# Patient Record
Sex: Female | Born: 1955 | Race: White | Hispanic: No | Marital: Married | Smoking: Former smoker
Health system: Southern US, Community
[De-identification: ages and names within clinical notes are randomized; demographics above are authoritative.]

## PROBLEM LIST (undated history)

## (undated) DIAGNOSIS — M1990 Unspecified osteoarthritis, unspecified site: Secondary | ICD-10-CM

## (undated) DIAGNOSIS — K219 Gastro-esophageal reflux disease without esophagitis: Secondary | ICD-10-CM

## (undated) DIAGNOSIS — T7840XA Allergy, unspecified, initial encounter: Secondary | ICD-10-CM

## (undated) DIAGNOSIS — J40 Bronchitis, not specified as acute or chronic: Principal | ICD-10-CM

## (undated) DIAGNOSIS — I10 Essential (primary) hypertension: Secondary | ICD-10-CM

## (undated) DIAGNOSIS — R0602 Shortness of breath: Secondary | ICD-10-CM

## (undated) DIAGNOSIS — E785 Hyperlipidemia, unspecified: Secondary | ICD-10-CM

## (undated) HISTORY — DX: Allergy, unspecified, initial encounter: T78.40XA

## (undated) HISTORY — DX: Bronchitis, not specified as acute or chronic: J40

## (undated) HISTORY — DX: Unspecified osteoarthritis, unspecified site: M19.90

## (undated) HISTORY — PX: POLYPECTOMY: SHX149

## (undated) HISTORY — DX: Hyperlipidemia, unspecified: E78.5

## (undated) HISTORY — DX: Shortness of breath: R06.02

## (undated) HISTORY — PX: TONSILLECTOMY: SUR1361

## (undated) HISTORY — PX: RETROPUBIC SLING: SHX2343

## (undated) HISTORY — DX: Gastro-esophageal reflux disease without esophagitis: K21.9

## (undated) HISTORY — PX: COLONOSCOPY: SHX174

## (undated) HISTORY — DX: Essential (primary) hypertension: I10

## (undated) HISTORY — PX: VAGINAL HYSTERECTOMY: SUR661

## (undated) HISTORY — PX: CYSTOCELE REPAIR: SHX163

---

## 1984-05-16 HISTORY — PX: DILATION AND CURETTAGE OF UTERUS: SHX78

## 2005-04-04 ENCOUNTER — Encounter: Payer: Self-pay | Admitting: Family Medicine

## 2005-10-03 DIAGNOSIS — J984 Other disorders of lung: Secondary | ICD-10-CM | POA: Insufficient documentation

## 2006-11-14 HISTORY — PX: OTHER SURGICAL HISTORY: SHX169

## 2006-11-14 LAB — CONVERTED CEMR LAB: Pap Smear: NORMAL

## 2007-04-16 HISTORY — PX: BLADDER SUSPENSION: SHX72

## 2007-04-18 ENCOUNTER — Encounter: Payer: Self-pay | Admitting: Family Medicine

## 2007-09-25 ENCOUNTER — Encounter: Payer: Self-pay | Admitting: Family Medicine

## 2007-10-26 ENCOUNTER — Encounter: Payer: Self-pay | Admitting: Family Medicine

## 2007-11-26 ENCOUNTER — Encounter: Payer: Self-pay | Admitting: Family Medicine

## 2008-03-28 ENCOUNTER — Ambulatory Visit: Payer: Self-pay | Admitting: Family Medicine

## 2008-03-28 DIAGNOSIS — M1990 Unspecified osteoarthritis, unspecified site: Secondary | ICD-10-CM | POA: Insufficient documentation

## 2008-03-28 DIAGNOSIS — E785 Hyperlipidemia, unspecified: Secondary | ICD-10-CM | POA: Insufficient documentation

## 2008-03-28 DIAGNOSIS — I10 Essential (primary) hypertension: Secondary | ICD-10-CM | POA: Insufficient documentation

## 2008-03-28 DIAGNOSIS — K219 Gastro-esophageal reflux disease without esophagitis: Secondary | ICD-10-CM | POA: Insufficient documentation

## 2008-05-13 ENCOUNTER — Ambulatory Visit: Payer: Self-pay | Admitting: Family Medicine

## 2008-05-14 LAB — CONVERTED CEMR LAB
Albumin: 3.3 g/dL — ABNORMAL LOW (ref 3.5–5.2)
Alkaline Phosphatase: 68 units/L (ref 39–117)
Bilirubin, Direct: 0.1 mg/dL (ref 0.0–0.3)
GFR calc Af Amer: 113 mL/min
GFR calc non Af Amer: 93 mL/min
LDL Cholesterol: 100 mg/dL — ABNORMAL HIGH (ref 0–99)
Potassium: 3.8 meq/L (ref 3.5–5.1)
Sodium: 138 meq/L (ref 135–145)
VLDL: 23 mg/dL (ref 0–40)

## 2008-06-02 ENCOUNTER — Ambulatory Visit: Payer: Self-pay | Admitting: Family Medicine

## 2008-06-02 DIAGNOSIS — R7309 Other abnormal glucose: Secondary | ICD-10-CM | POA: Insufficient documentation

## 2008-06-02 LAB — CONVERTED CEMR LAB: HDL goal, serum: 40 mg/dL

## 2008-06-03 ENCOUNTER — Ambulatory Visit: Payer: Self-pay | Admitting: Family Medicine

## 2008-06-03 DIAGNOSIS — M2020 Hallux rigidus, unspecified foot: Secondary | ICD-10-CM | POA: Insufficient documentation

## 2008-08-05 ENCOUNTER — Ambulatory Visit: Payer: Self-pay | Admitting: Family Medicine

## 2008-08-18 ENCOUNTER — Ambulatory Visit: Payer: Self-pay | Admitting: Cardiology

## 2008-08-19 ENCOUNTER — Telehealth: Payer: Self-pay | Admitting: Family Medicine

## 2008-12-02 ENCOUNTER — Encounter: Payer: Self-pay | Admitting: Family Medicine

## 2008-12-08 ENCOUNTER — Ambulatory Visit: Payer: Self-pay | Admitting: Family Medicine

## 2008-12-08 LAB — CONVERTED CEMR LAB
ALT: 20 units/L (ref 0–35)
AST: 20 units/L (ref 0–37)
Albumin: 3.5 g/dL (ref 3.5–5.2)
BUN: 13 mg/dL (ref 6–23)
CO2: 26 meq/L (ref 19–32)
Cholesterol: 174 mg/dL (ref 0–200)
GFR calc non Af Amer: 92.88 mL/min (ref >60)
Glucose, Bld: 101 mg/dL — ABNORMAL HIGH (ref 70–99)
Potassium: 3.9 meq/L (ref 3.5–5.1)
Total Protein: 6.7 g/dL (ref 6.0–8.3)
VLDL: 15.4 mg/dL (ref 0.0–40.0)

## 2008-12-11 ENCOUNTER — Ambulatory Visit: Payer: Self-pay | Admitting: Family Medicine

## 2009-01-28 ENCOUNTER — Encounter: Admission: RE | Admit: 2009-01-28 | Discharge: 2009-01-28 | Payer: Self-pay | Admitting: Family Medicine

## 2009-02-03 ENCOUNTER — Encounter (INDEPENDENT_AMBULATORY_CARE_PROVIDER_SITE_OTHER): Payer: Self-pay | Admitting: *Deleted

## 2009-04-07 ENCOUNTER — Encounter (INDEPENDENT_AMBULATORY_CARE_PROVIDER_SITE_OTHER): Payer: Self-pay | Admitting: Internal Medicine

## 2009-04-07 ENCOUNTER — Ambulatory Visit: Payer: Self-pay | Admitting: Family Medicine

## 2009-04-07 ENCOUNTER — Encounter: Payer: Self-pay | Admitting: Internal Medicine

## 2009-04-15 ENCOUNTER — Ambulatory Visit: Payer: Self-pay | Admitting: Internal Medicine

## 2009-04-16 ENCOUNTER — Encounter: Payer: Self-pay | Admitting: Internal Medicine

## 2009-04-18 ENCOUNTER — Ambulatory Visit: Payer: Self-pay | Admitting: Family Medicine

## 2009-04-18 LAB — CONVERTED CEMR LAB: Rapid Strep: POSITIVE

## 2009-04-20 ENCOUNTER — Telehealth (INDEPENDENT_AMBULATORY_CARE_PROVIDER_SITE_OTHER): Payer: Self-pay | Admitting: Radiology

## 2009-04-21 ENCOUNTER — Ambulatory Visit: Payer: Self-pay

## 2009-04-21 ENCOUNTER — Encounter (HOSPITAL_COMMUNITY): Admission: RE | Admit: 2009-04-21 | Discharge: 2009-04-21 | Payer: Self-pay | Admitting: Internal Medicine

## 2009-04-21 ENCOUNTER — Ambulatory Visit: Payer: Self-pay | Admitting: Internal Medicine

## 2009-04-23 ENCOUNTER — Encounter: Payer: Self-pay | Admitting: Internal Medicine

## 2009-04-23 ENCOUNTER — Ambulatory Visit: Payer: Self-pay

## 2009-05-06 ENCOUNTER — Ambulatory Visit: Payer: Self-pay | Admitting: Family Medicine

## 2009-06-01 ENCOUNTER — Ambulatory Visit: Payer: Self-pay | Admitting: Family Medicine

## 2009-06-04 LAB — CONVERTED CEMR LAB
ALT: 22 units/L (ref 0–35)
AST: 21 units/L (ref 0–37)
Albumin: 3.5 g/dL (ref 3.5–5.2)
Alkaline Phosphatase: 65 units/L (ref 39–117)
Bilirubin, Direct: 0.1 mg/dL (ref 0.0–0.3)
CO2: 26 meq/L (ref 19–32)
Chloride: 106 meq/L (ref 96–112)
Glucose, Bld: 87 mg/dL (ref 70–99)
HDL: 48.4 mg/dL (ref >39.00)
Potassium: 4.3 meq/L (ref 3.5–5.1)
Sodium: 141 meq/L (ref 135–145)
Total Protein: 6.2 g/dL (ref 6.0–8.3)

## 2009-06-12 ENCOUNTER — Ambulatory Visit: Payer: Self-pay | Admitting: Family Medicine

## 2009-06-12 DIAGNOSIS — Z8679 Personal history of other diseases of the circulatory system: Secondary | ICD-10-CM | POA: Insufficient documentation

## 2009-07-01 ENCOUNTER — Telehealth: Payer: Self-pay | Admitting: Family Medicine

## 2009-07-02 ENCOUNTER — Ambulatory Visit: Payer: Self-pay | Admitting: Internal Medicine

## 2009-12-03 ENCOUNTER — Encounter (INDEPENDENT_AMBULATORY_CARE_PROVIDER_SITE_OTHER): Payer: Self-pay | Admitting: *Deleted

## 2009-12-08 ENCOUNTER — Ambulatory Visit: Payer: Self-pay | Admitting: Family Medicine

## 2009-12-11 LAB — CONVERTED CEMR LAB
AST: 20 units/L (ref 0–37)
Albumin: 3.7 g/dL (ref 3.5–5.2)
Alkaline Phosphatase: 74 units/L (ref 39–117)
Bilirubin, Direct: 0.1 mg/dL (ref 0.0–0.3)
CO2: 27 meq/L (ref 19–32)
Glucose, Bld: 99 mg/dL (ref 70–99)
Potassium: 3.8 meq/L (ref 3.5–5.1)
Sodium: 138 meq/L (ref 135–145)
Total CHOL/HDL Ratio: 3
Total Protein: 6.6 g/dL (ref 6.0–8.3)

## 2009-12-16 ENCOUNTER — Ambulatory Visit: Payer: Self-pay | Admitting: Family Medicine

## 2010-01-01 ENCOUNTER — Telehealth: Payer: Self-pay | Admitting: Family Medicine

## 2010-01-06 ENCOUNTER — Telehealth: Payer: Self-pay | Admitting: Family Medicine

## 2010-01-29 ENCOUNTER — Encounter: Admission: RE | Admit: 2010-01-29 | Discharge: 2010-01-29 | Payer: Self-pay | Admitting: Family Medicine

## 2010-05-04 ENCOUNTER — Telehealth: Payer: Self-pay | Admitting: Family Medicine

## 2010-06-15 NOTE — Progress Notes (Signed)
Summary: regarding yeast infection  Phone Note Call from Patient Call back at Work Phone [phone]   Caller: Patient Call For: Kerby Nora MD Summary of Call: Pt was seen for a yeast infection under her breast 2 weeks ago.  She says this is not getting any better and she is asking if you want to prescribe a powder or do you want to see her again.  Please advise.  [facility]  Leave message if she is not in. Initial call taken by: Lowella Petties CMA,  July 01, 2009 3:12 PM  Follow-up for Phone Call        If not improving needs to be seen in follow up or Derm referral. Nonurgent.  Follow-up by: Kerby Nora MD,  July 01, 2009 6:07 PM  Additional Follow-up for Phone Call Additional follow up Details #1::        Patient would like to see if she can get in with a dermatologist Additional Follow-up by: Benny Lennert CMA (AAMA),  July 02, 2009 8:21 AM

## 2010-06-15 NOTE — Progress Notes (Signed)
Summary: blood pressure readings  Phone Note Call from Patient Call back at Home Phone [phone]   Caller: Patient Call For: Kerby Nora MD Summary of Call: Patient is calling w/ BP readings-  112/79 on 8-4, 110/75 on 8-8, 96/65 on 8-9, 108/75 on 8/10, 110/78 on 8-15, 107/72 on 8-17, 107/68 on 8-19, 101/67 on 8-21, 104/69 on 8-24. She was told to call and report this. Patient says that it is okay to leave message if she doesn't  answer.  Initial call taken by: Melody Comas,  January 06, 2010 4:58 PM  Follow-up for Phone Call        normal - i would make no changes Hannah Beat MD  January 06, 2010 5:08 PM   Additional Follow-up for Phone Call Additional follow up Details #1::        Patient advised via message on machine as requested by patient.Consuello Masse CMA   Additional Follow-up by: Benny Lennert CMA Duncan Dull),  January 07, 2010 8:44 AM

## 2010-06-15 NOTE — Assessment & Plan Note (Signed)
Summary: F2M/AMD   Primary Provider:  Everrett Coombe NP   CC:  occasional cp, little sob - not to much, and edema all the time (not to bad).  History of Present Illness: 55 y/o woman with history HTN, hyperlipidemia, obesity and GERD. Returns for f/u on her dyspnea.   Since we last saw her had  echo 12/10: EF 55-60% normal. Myoview 12/10 EF 83% normal. Dyspnea much improved. No orthopnea, PND. Mild dependent edema. Still with mild palpitations but improved. No syncpe or presyncope. Walking 1 mile on occasion.     Current Medications (verified): 1)  Vytorin 10-40 Mg Tabs (Ezetimibe-Simvastatin) .... Take 1 Tablet By Mouth Once A Day 2)  Avalide 150-12.5 Mg Tabs (Irbesartan-Hydrochlorothiazide) .... Take 1 Tablet By Mouth Once A Day 3)  Estrace 0.5 Mg Tabs (Estradiol) .... Take 1 Tablet By Mouth Once A Day 4)  Fluticasone Propionate 50 Mcg/act Susp (Fluticasone Propionate) .... One Spray Per Nostril Daily 5)  Glucosamine-Chondroitin   Caps (Glucosamine-Chondroit-Vit C-Mn) .... Take 1 Tablet By Mouth Once A Day 6)  Multivitamins   Tabs (Multiple Vitamin) .... Take 1 Tablet By Mouth Once A Day 7)  Vitamin E 600 Unit  Caps (Vitamin E) .... Take 1 Capsule By Mouth Once A Day 8)  Aspirin 81 Mg Tbec (Aspirin) .... Take One Tablet By Mouth Daily 9)  Metoprolol Succinate 50 Mg Xr24h-Tab (Metoprolol Succinate) .... Take One Tablet By Mouth Daily 10)  Cheratussin Ac 100-10 Mg/43ml Syrp (Guaifenesin-Codeine) .... 5 Ml At Night For Cough As Needed. 11)  Nystatin 100000 Unit/gm Crea (Nystatin) .... Apply To Affected Area Two Times A Day X 2 Weeks..continue 48 Hours After Symptoms Resolved.  Allergies (verified): No Known Drug Allergies  Past History:  Past Medical History: Osteoarthritis GERD Hyperlipidemia Hypertension Dyspnea    --echo 12/10: EF 55-60% normal    --Myoview 12/10 EF 83%. normal  Review of Systems       As per HPI and past medical history; otherwise all systems  negative.   Vital Signs:  Patient profile:   55 year old female Height:      65 inches Weight:      210.25 pounds BMI:     35.11 Pulse rate:   72 / minute Pulse rhythm:   regular BP sitting:   104 / 60  (left arm) Cuff size:   large  Vitals Entered By: Mercer Pod (July 02, 2009 4:05 PM)  Physical Exam  General:  Gen: well appearing. no resp difficulty HEENT: normal Neck: supple. no JVD. Carotids 2+ bilat; no bruits.  Cor: PMI nondisplaced. Regular rate & rhythm. No rubs, gallops, murmur. Lungs: clear Abdomen: soft, nontender, nondistended. No hepatosplenomegaly. No bruits or masses. Good bowel sounds. Extremities: no cyanosis, clubbing, rash, 1+ edema Neuro: alert & orientedx3, cranial nerves grossly intact. moves all 4 extremities w/o difficulty. affect pleasant    Impression & Recommendations:  Problem # 1:  DYSPNEA (ICD-786.05) Imporved. We reviewed all her studies including echo and Myoview. These have been normal. I reassured her. Suspect dyspnea due to weight and decondtioning. Suggested routine exercise program.  Problem # 2:  PALPITATIONS, HX OF (ICD-V12.50) Improved. Continue b-blocker.

## 2010-06-15 NOTE — Letter (Signed)
Summary: [place] No Show Letter  [place] [facility]  [address] [place], [place] [address]   Phone: [phone]  Fax: [phone]    12/03/2009 MRN: [id number]  [facility] [address] [place], [place]  [address]   Dear Ms. Reisen,   Our records indicate that you missed your scheduled appointment with ___lab__________________ on _7.21.11___________.  Please contact this office to reschedule your appointment as soon as possible.  It is important that you keep your scheduled appointments with your physician, so we can provide you the best care possible.  Please be advised that there may be a charge for "no show" appointments.    Sincerely,   [place] [facility]

## 2010-06-15 NOTE — Assessment & Plan Note (Signed)
Summary: FOLLOW UP AFTER LABS/[facility]   Vital Signs:  Patient profile:   55 year old female Weight:      209.13 pounds BMI:     34.93 Temp:     98.0 degrees F oral Pulse rate:   80 / minute Pulse rhythm:   regular BP sitting:   100 / 68  (left arm) Cuff size:   large  Vitals Entered By: Linde Gillis CMA Duncan Dull) (June 12, 2009 4:18 PM) CC: follow-up visit after labs   History of Present Illness: In last few week , intermittantly in past ..left skin rash red under breast, itchy, burning.. Has used gold bond, neosporin.  SOB, Chest pain and palpitations: improved with BBlocker. Stress test and ECHO low risk.   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 Take 1 Tablet By Mouth Once A Day 2)  Avalide 150-12.5 Mg Tabs (Irbesartan-Hydrochlorothiazide) .... Take 1 Tablet By Mouth Once A Day 3)  Estrace 0.5 Mg Tabs (Estradiol) .... Take 1 Tablet By Mouth Once A Day 4)  Fluticasone Propionate 50 Mcg/act Susp (Fluticasone Propionate) .... One Spray Per Nostril Daily 5)  Glucosamine-Chondroitin   Caps (Glucosamine-Chondroit-Vit C-Mn) .... Take 1 Tablet By Mouth Once A Day 6)  Multivitamins   Tabs (Multiple Vitamin) .... Take 1 Tablet By Mouth Once A Day 7)  Vitamin E 600 Unit  Caps (Vitamin E) .... Take 1 Capsule By Mouth Once A Day 8)  Aspirin 81 Mg Tbec (Aspirin) .... Take One Tablet By Mouth Daily 9)   Metoprolol Succinate 50 Mg Xr24h-Tab (Metoprolol Succinate) .... Take One Tablet By Mouth Daily 10)  Cheratussin Ac 100-10 Mg/3ml Syrp (Guaifenesin-Codeine) .... 5 Ml At Night For Cough As Needed. 11)  Nystatin 100000 Unit/gm Crea (Nystatin) .... Apply To Affected Area Two Times A Day X 2 Weeks..continue 48 Hours After Symptoms Resolved.  Allergies (verified): No Known Drug Allergies  Past History:  Past medical, surgical, family and social histories (including risk factors) reviewed, and no changes noted (except as noted below).  Past Medical History: Reviewed history from 03/28/2008 and no changes required. Osteoarthritis GERD Hyperlipidemia Hypertension  Past Surgical History: Reviewed history from 08/07/2008 and no changes required. 04/2007 bladder sling, total vaginal hysterectomy, no cervix 1986 misscarriage, Dand C 11/2006 Chest CT: pulmonary nodules right middle  Family History: Reviewed history from 04/15/2009 and no changes required. father: CAD, HTN, high chol  s/p re-do CABG died 31 CHF mother CAD, HTN, high chol, DM died 76 MI sister: MI < age 28 2 sisters: DM no cancer  Social History: Reviewed history from 04/15/2009 and no changes required. Occupation: Optician, dispensing Married 3 kids: asthma Former Smoker 15 pack year history quit 1989 Alcohol use-yes  1-2 beers daily Drug use-no Regular exercise-no Diet: druits and veggies  Review of Systems General:  Denies fatigue and fever. CV:  Denies chest pain or discomfort. Resp:  Denies shortness of breath. GI:  Denies  abdominal pain. GU:  Denies dysuria.  Physical Exam  General:  Well-developed,well-nourished,in no acute distress; alert,appropriate and cooperative throughout examination Mouth:  MMM no pharyngeal edema, no exudates. Neck:  no carotid bruit or thyromegaly no cervical or supraclavicular lymphadenopathy  Breasts:  erythema under left breast  Lungs:  Normal respiratory effort, chest expands  symmetrically. Lungs are clear to auscultation, no crackles or wheezes. Heart:  Normal rate and regular rhythm. S1 and S2 normal without gallop, murmur, click, rub or other extra sounds. Pulses:  R and L posterior tibial pulses are full and equal bilaterally  Extremities:  no edema   Impression & Recommendations:  Problem # 1:  INTERTRIGO, CANDIDAL (ICD-695.89) Treat with Nystatin topical. Continue for 48 hours after rash resolved. Call if not improving as expected.  Problem # 2:  HYPERTENSION (ICD-401.9) Well controlled. Continue current medication.  Her updated medication list for this problem includes:    Avalide 150-12.5 Mg Tabs (Irbesartan-hydrochlorothiazide) .Marland Kitchen... Take 1 tablet by mouth once a day    Metoprolol Succinate 50 Mg Xr24h-tab (Metoprolol succinate) .Marland Kitchen... Take one tablet by mouth daily  BP today: 100/68 Prior BP: 112/84 (05/06/2009)  Prior 10 Yr Risk Heart Disease: 4 % (12/11/2008)  Labs Reviewed: K+: 4.3 (06/01/2009) Creat: : 0.7 (06/01/2009)   Chol: 160 (06/01/2009)   HDL: 48.40 (06/01/2009)   LDL: 91 (06/01/2009)   TG: 101.0 (06/01/2009)  Problem # 3:  PREDIABETES (ICD-790.29) Resolved.  Labs Reviewed: Creat: 0.7 (06/01/2009)     Problem # 4:  HYPERLIPIDEMIA (ICD-272.4)  Her updated medication list for this problem includes:    Vytorin 10-40 Mg Tabs (Ezetimibe-simvastatin) .Marland Kitchen... Take 1 tablet by mouth once a day Well controlled.   SGOT: 21 (06/01/2009)   SGPT: 22 (06/01/2009)  Lipid Goals: Chol Goal: 200 (06/02/2008)   HDL Goal: 40 (06/02/2008)   LDL Goal: 160 (06/02/2008)   TG Goal: 150 (06/02/2008)  Prior 10 Yr Risk Heart Disease: 4 % (12/11/2008)   HDL:48.40 (06/01/2009), 52.60 (12/08/2008)  LDL:91 (06/01/2009), 106 (16/02/9603)  Chol:160 (06/01/2009), 174 (12/08/2008)  Trig:101.0 (06/01/2009), 77.0 (12/08/2008)  Complete Medication List: 1)  Vytorin 10-40 Mg Tabs (Ezetimibe-simvastatin) .... Take 1 tablet by mouth once a day 2)  Avalide 150-12.5  Mg Tabs (Irbesartan-hydrochlorothiazide) .... Take 1 tablet by mouth once a day 3)  Estrace 0.5 Mg Tabs (Estradiol) .... Take 1 tablet by mouth once a day 4)  Fluticasone Propionate 50 Mcg/act Susp (Fluticasone propionate) .... One spray per nostril daily 5)  Glucosamine-chondroitin Caps (Glucosamine-chondroit-vit c-mn) .... Take 1 tablet by mouth once a day 6)  Multivitamins Tabs (Multiple vitamin) .... Take 1 tablet by mouth once a day 7)  Vitamin E 600 Unit Caps (Vitamin e) .... Take 1 capsule by mouth once a day 8)  Aspirin 81 Mg Tbec (Aspirin) .... Take one tablet by mouth daily 9)  Metoprolol Succinate 50 Mg Xr24h-tab (Metoprolol succinate) .... Take one tablet by mouth daily 10)  Cheratussin Ac 100-10 Mg/71ml Syrp (Guaifenesin-codeine) .... 5 ml at night for cough as needed. 11)  Nystatin 100000 Unit/gm Crea (Nystatin) .... Apply to affected area two times a day x 2 weeks..continue 48 hours after symptoms resolved.   Patient Instructions: 1)  Please schedule a follow-up appointment in 6 months .  2)  BMP prior to visit, ICD-9: 272.0 3)  Hepatic Panel prior to visit ICD-9:  4)  Lipid panel prior to visit ICD-9 :  Prescriptions: NYSTATIN 100000 UNIT/GM CREA (NYSTATIN) Apply to affected area two times a day  x 2 weeks..continue 48 hours after symptoms resolved.  #30 gm x 0   Entered and Authorized by:   Kerby Nora MD   Signed by:   Kerby Nora MD on 06/12/2009   Method used:   Electronically to        [facility]* (retail)       [address]       [place], [place]  [address]       Ph: [phone]       Fax: [phone]   RxID:   [id number] ESTRACE 0.5 MG TABS (ESTRADIOL) Take 1 tablet by mouth once a day  #90 x 3   Entered and Authorized by:   Kerby Nora MD   Signed by:   Kerby Nora MD on 06/12/2009   Method used:   Electronically to        [facility]* (retail)       [address]       [place], [place]  [address]       Ph: [phone]       Fax: [phone]    RxID:   [id number] AVALIDE 150-12.5 MG TABS (IRBESARTAN-HYDROCHLOROTHIAZIDE) Take 1 tablet by mouth once a day  #90 x 3   Entered and Authorized by:   Kerby Nora MD   Signed by:   Kerby Nora MD on 06/12/2009   Method used:   Faxed to ...       [facility] (mail-order)       [address]       [place], [place]  [address]       Ph: [phone]       Fax: [phone]   RxID:   [id number] VYTORIN 10-40 MG TABS (EZETIMIBE-SIMVASTATIN) Take 1 tablet by mouth once a day  #90 x 3   Entered and Authorized by:   Kerby Nora MD   Signed by:   Kerby Nora MD on 06/12/2009   Method used:   Electronically to        [facility]* (retail)       [address]       [place], [place]  [address]       Ph: [phone]       Fax: [phone]   RxID:   [id number] FLUTICASONE PROPIONATE 50 MCG/ACT SUSP (FLUTICASONE PROPIONATE) One spray per nostril daily  #3 x 3   Entered and Authorized by:   Kerby Nora MD   Signed by:   Kerby Nora MD on 06/12/2009   Method used:   Electronically to        [facility]* (retail)       [address]       [place], [place]  [address]       Ph: [phone]       Fax: [phone]   RxID:   [id number]   Current Allergies (reviewed today): No known allergies

## 2010-06-15 NOTE — Assessment & Plan Note (Signed)
Summary: 6 m f/u dlo R/S FROM 12/11/09   Vital Signs:  Patient profile:   55 year old female Height:      65 inches Weight:      205.6 pounds BMI:     34.34 Temp:     98.1 degrees F oral Pulse rate:   72 / minute Pulse rhythm:   regular BP sitting:   100 / 70  (left arm) Cuff size:   large  Vitals Entered By: Benny Lennert CMA Duncan Dull) (December 16, 2009 9:25 AM)  History of Present Illness: Chief complaint 6 month follow up  06/2009 saw Cards for dyspnea.Marland Kitchenecho 12/10: EF 55-60% normal. Myoview 12/10 EF 83% normal. Dyspnea much improved. No orthopnea, PND. Mild dependent edema. Still with mild palpitations but improved. No syncpe or presyncope. Walking 1 mile on occasion. per Card...dyspnea due to weight and deconditioning.  Walking 5 times a day...has lost 8 lbs.   Hypertension History:      She denies headache, chest pain, palpitations, dyspnea with exertion, peripheral edema, neurologic problems, and syncope.  She notes no problems with any antihypertensive medication side effects.  well controlled..not checking at home.   Occ intermittant lightheadedness.        Positive major cardiovascular risk factors include hyperlipidemia and hypertension.  Negative major cardiovascular risk factors include female age less than 12 years old and non-tobacco-user status.    Lipid Management History:      Positive NCEP/ATP III risk factors include hypertension.  Negative NCEP/ATP III risk factors include female age less than 55 years old and non-tobacco-user status.        The patient states that she knows about the "Therapeutic Lifestyle Change" diet.  Her compliance with the TLC diet is good.  The patient expresses understanding of adjunctive measures for cholesterol lowering.  Adjunctive measures started by the patient include aerobic exercise and omega-3 supplements.  She expresses no side effects from her lipid-lowering medication.  The patient denies any symptoms to suggest myopathy or liver  disease.      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 Take 1 Tablet By Mouth Once A Day 2)  Avalide 150-12.5 Mg Tabs (Irbesartan-Hydrochlorothiazide) .... Take 1 Tablet By Mouth Once A Day 3)  Estrace 0.5 Mg Tabs (Estradiol) .... Take 1 Tablet By Mouth Once A Day 4)  Fluticasone Propionate 50 Mcg/act Susp (Fluticasone Propionate) .... One Spray Per Nostril Daily 5)  Glucosamine-Chondroitin   Caps (Glucosamine-Chondroit-Vit C-Mn) .... Take 1 Tablet By Mouth Once A Day 6)  Multivitamins   Tabs (Multiple Vitamin) .... Take 1 Tablet By Mouth Once A Day 7)  Vitamin E 600 Unit  Caps (Vitamin E) .... Take 1 Capsule By Mouth Once A Day 8)  Aspirin 81 Mg Tbec (Aspirin) .... Take One Tablet By Mouth Daily 9)  Metoprolol Succinate 50 Mg Xr24h-Tab (Metoprolol Succinate) .... Take One Tablet By Mouth Daily  Allergies (verified): No Known Drug Allergies  Past History:  Past medical, surgical, family and social histories (including risk factors) reviewed, and no changes noted (except as noted below).  Past Medical History: Reviewed history from 07/02/2009 and no changes required. Osteoarthritis GERD Hyperlipidemia Hypertension Dyspnea    --echo 12/10: EF 55-60% normal    --Myoview 12/10 EF 83%. normal  Past Surgical History: Reviewed history from 08/07/2008 and no changes required. 04/2007 bladder sling, total vaginal  hysterectomy, no cervix 1986 misscarriage, Dand C 11/2006 Chest CT: pulmonary nodules right middle  Family History: Reviewed history from 04/15/2009 and no changes required. father: CAD, HTN, high chol  s/p re-do CABG died 57 CHF mother CAD, HTN, high chol, DM died 31 MI sister: MI < age 80 2 sisters: DM no cancer  Social History: Reviewed history from 04/15/2009 and no changes required. Occupation: Optician, dispensing Married 3 kids: asthma Former Smoker 15 pack year history quit 1989 Alcohol use-yes  1-2 beers daily Drug use-no Regular exercise-no Diet: druits and veggies  Review of Systems       occ sharp pains behind eyes for year...see eye MD...occur once every few months, last 1 sec General:  Denies fatigue. ENT:  Denies earache, nasal congestion, postnasal drainage, and ringing in ears; No vertigo. CV:  Denies chest pain or discomfort. Resp:  Denies cough, sputum productive, and wheezing. GI:  Denies abdominal pain, bloody stools, constipation, and diarrhea. GU:  Denies dysuria.  Physical Exam  General:  obese appearing female in NAD Head:  Normocephalic and atraumatic without obvious abnormalities. No apparent alopecia or balding. Eyes:  No corneal or conjunctival inflammation noted. EOMI. Perrla. Funduscopic exam benign, without hemorrhages, exudates or papilledema. Vision grossly normal. Ears:  External ear exam shows no significant lesions or deformities.  Otoscopic examination reveals clear canals, tympanic membranes are intact bilaterally without bulging, retraction, inflammation or discharge. Hearing is grossly normal bilaterally. Nose:  External nasal examination shows no deformity or inflammation. Nasal mucosa are pink and moist without lesions or exudates. Mouth:  Oral mucosa and oropharynx without lesions or exudates.  Teeth in good repair. Neck:  no carotid bruit or thyromegaly no cervical or supraclavicular lymphadenopathy  Lungs:  Normal respiratory effort,  chest expands symmetrically. Lungs are clear to auscultation, no crackles or wheezes. Heart:  Normal rate and regular rhythm. S1 and S2 normal without gallop, murmur, click, rub or other extra sounds. Abdomen:  Bowel sounds positive,abdomen soft and non-tender without masses, organomegaly or hernias noted. Pulses:  R and L posterior tibial pulses are full and equal bilaterally  Extremities:  no edema  Neurologic:  No cranial nerve deficits noted. Station and gait are normal. Plantar reflexes are down-going bilaterally. DTRs are symmetrical throughout. Sensory, motor and coordinative functions appear intact. Skin:  Intact without suspicious lesions or rashes Psych:  Cognition and judgment appear intact. Alert and cooperative with normal attention span and concentration. No apparent delusions, illusions, hallucinations   Impression & Recommendations:  Problem # 1:  DYSPNEA (ICD-786.05) Improved. Continue exercise and lifestyle changes.  Problem # 2:  PREDIABETES (ICD-790.29) Improved with lifestyle.   Problem # 3:  HYPERTENSION (ICD-401.9) Possibly running too low...check BP with dizzyness.  Call if BP less than 100/70.Marland Kitchenwill consider decreasing avalide.  Her updated medication list for this problem includes:    Avalide 150-12.5 Mg Tabs (Irbesartan-hydrochlorothiazide) .Marland Kitchen... Take 1 tablet by mouth once a day    Metoprolol Succinate 50 Mg Xr24h-tab (Metoprolol succinate) .Marland Kitchen... Take one tablet by mouth daily  Problem # 4:  HYPERLIPIDEMIA (ICD-272.4) Well controlled. Continue current  medication.  Her updated medication list for this problem includes:    Vytorin 10-40 Mg Tabs (Ezetimibe-simvastatin) .Marland Kitchen... Take 1 tablet by mouth once a day  Complete Medication List: 1)  Vytorin 10-40 Mg Tabs (Ezetimibe-simvastatin) .... Take 1 tablet by mouth once a day 2)  Avalide 150-12.5 Mg Tabs (Irbesartan-hydrochlorothiazide) .... Take 1 tablet by mouth once a day 3)  Estrace 0.5 Mg Tabs (Estradiol)  .... Take 1 tablet by mouth once a day 4)  Fluticasone Propionate 50 Mcg/act Susp (Fluticasone propionate) .... One spray per nostril daily 5)  Glucosamine-chondroitin Caps (Glucosamine-chondroit-vit c-mn) .... Take 1 tablet by mouth once a day 6)  Multivitamins Tabs (Multiple vitamin) .... Take 1 tablet by mouth once a day 7)  Vitamin E 600 Unit Caps (Vitamin e) .... Take 1 capsule by mouth once a day 8)  Aspirin 81 Mg Tbec (Aspirin) .... Take one tablet by mouth daily 9)  Metoprolol Succinate 50 Mg Xr24h-tab (Metoprolol succinate) .... Take one tablet by mouth daily  Other Orders: Radiology Referral (Radiology) Tdap => 66yrs IM (44034) Admin 1st Vaccine (74259) Admin 1st Vaccine [facility]) [id number])  Hypertension Assessment/Plan:      The patient's hypertensive risk group is category B: At least one risk factor (excluding diabetes) with no target organ damage.  Her calculated 10 year risk of coronary heart disease is 3 %.  Today's blood pressure is 100/70.  Her blood pressure goal is < 140/90.  Lipid Assessment/Plan:      Based on NCEP/ATP III, the patient's risk factor category is "0-1 risk factors".  The patient's lipid goals are as follows: Total cholesterol goal is 200; LDL cholesterol goal is 160; HDL cholesterol goal is 40; Triglyceride goal is 150.  Her LDL cholesterol goal has been met.    Patient Instructions: 1)  When lightheaded..check BP. Call if running low for consideration of decreasing BP med...less than 100/70. 2)   Schedule CPX in 6 months with fasting lipids, CMET prior Dx 272.0 3)  Referral Appointment Information 4)  Day/Date: 5)  Time: 6)  Place/MD: 7)  Address: 8)  Phone/Fax: 9)  Patient given appointment information. Information/Orders faxed/mailed.  Prescriptions: VYTORIN 10-40 MG TABS (EZETIMIBE-SIMVASTATIN) Take 1 tablet by mouth once a day  #90 x 3   Entered and Authorized by:   Kerby Nora MD   Signed by:   Kerby Nora MD on 12/16/2009   Method used:    Electronically to        [facility]* (retail)       [address]       [place], [place]  [address]       Ph: [phone]       Fax: [phone]   RxID:   [id number] METOPROLOL SUCCINATE 50 MG XR24H-TAB (METOPROLOL SUCCINATE) Take one tablet by mouth daily  #90 x 3   Entered and Authorized by:   Kerby Nora MD   Signed by:   Kerby Nora MD on 12/16/2009   Method used:   Electronically to        [facility]* (retail)       [address]       [place], [place]  [address]       Ph: [phone]       Fax: [phone]   RxID:   [id number]   Current Allergies (reviewed today): No known allergies   Flex Sig Next Due:  Not Indicated Hemoccult Next Due:  Not Indicated     Tetanus/Td Vaccine  Vaccine Type: Tdap    Site: left deltoid    Mfr: [employer]Smith[employer]line    Dose: 0.5 ml    Route: IM    Given by: Benny Lennert CMA (AAMA)    Exp. Date: 11/13/2011    Lot #: [id number]    VIS given: 04/03/07 version given December 16, 2009.    Orders Added: 1)  Radiology Referral [Radiology] 2)  Est. Patient Level IV [09811] 3)  Tdap => 84yrs IM [90715] 4)  Admin 1st Vaccine [90471] 5)  Admin 1st Vaccine [facility]) [phone]

## 2010-06-15 NOTE — Progress Notes (Signed)
Summary: insurance wants to change from [employer] Note Other Incoming   Caller: Marine scientist of Call: Insurance company wants to change pt from avalide to a preferred medicine.  Form is on your desk. Initial call taken by: Lowella Petties CMA,  January 01, 2010 12:05 PM Caller: Rosann Auerbach  Follow-up for Phone Call        Notify pt that we will send in generic equivalent.   Additional Follow-up for Phone Call Additional follow up Details #1::        Patient advised.Consuello Masse CMA   Additional Follow-up by: Benny Lennert CMA Duncan Dull),  January 01, 2010 2:43 PM    New/Updated Medications: LOSARTAN POTASSIUM-HCTZ 50-12.5 MG TABS (LOSARTAN POTASSIUM-HCTZ) Take 1 tablet by mouth once a day Prescriptions: LOSARTAN POTASSIUM-HCTZ 50-12.5 MG TABS (LOSARTAN POTASSIUM-HCTZ) Take 1 tablet by mouth once a day  #30 x 11   Entered and Authorized by:   Kerby Nora MD   Signed by:   Kerby Nora MD on 01/01/2010   Method used:   Electronically to        [facility]* (retail)       [address]       [place], [place]  [address]       Ph: [phone]       Fax: [phone]   RxID:   [id number]

## 2010-06-17 NOTE — Progress Notes (Signed)
Summary: SOB, Vomitting  Phone Note Call from Patient Call back at Home Phone [phone]   Caller: Patient Call For: Kerby Nora MD Summary of Call: Patient states that her BP is 175/92, her pulse is 93 and she feels very dizzy and has started throwing up. She is also having some SOB. I advised her to go to ER. She agreed and will be going to [place].  Initial call taken by: Melody Comas,  May 04, 2010 1:36 PM  Follow-up for Phone Call        agree ASAP ER eval. Hannah Beat MD  May 04, 2010 1:41 PM

## 2010-06-24 ENCOUNTER — Other Ambulatory Visit (INDEPENDENT_AMBULATORY_CARE_PROVIDER_SITE_OTHER): Payer: Managed Care, Other (non HMO)

## 2010-06-24 ENCOUNTER — Encounter (INDEPENDENT_AMBULATORY_CARE_PROVIDER_SITE_OTHER): Payer: Self-pay | Admitting: *Deleted

## 2010-06-24 ENCOUNTER — Other Ambulatory Visit: Payer: Self-pay | Admitting: Family Medicine

## 2010-06-24 DIAGNOSIS — E785 Hyperlipidemia, unspecified: Secondary | ICD-10-CM

## 2010-06-24 LAB — LIPID PANEL
HDL: 50.8 mg/dL (ref >39.00)
Total CHOL/HDL Ratio: 3
VLDL: 10 mg/dL (ref 0.0–40.0)

## 2010-06-24 LAB — BASIC METABOLIC PANEL
CO2: 29 mEq/L (ref 19–32)
Calcium: 8.7 mg/dL (ref 8.4–10.5)
GFR: 102.41 mL/min (ref >60.00)
Glucose, Bld: 96 mg/dL (ref 70–99)
Potassium: 4.1 mEq/L (ref 3.5–5.1)
Sodium: 138 mEq/L (ref 135–145)

## 2010-06-24 LAB — HEPATIC FUNCTION PANEL
AST: 18 U/L (ref 0–37)
Albumin: 3.5 g/dL (ref 3.5–5.2)
Alkaline Phosphatase: 65 U/L (ref 39–117)
Bilirubin, Direct: 0.1 mg/dL (ref 0.0–0.3)

## 2010-06-29 ENCOUNTER — Encounter (INDEPENDENT_AMBULATORY_CARE_PROVIDER_SITE_OTHER): Payer: Managed Care, Other (non HMO) | Admitting: Family Medicine

## 2010-06-29 ENCOUNTER — Encounter: Payer: Self-pay | Admitting: Family Medicine

## 2010-06-29 DIAGNOSIS — Z860100 Personal history of colon polyps, unspecified: Secondary | ICD-10-CM | POA: Insufficient documentation

## 2010-06-29 DIAGNOSIS — Z0000 Encounter for general adult medical examination without abnormal findings: Secondary | ICD-10-CM

## 2010-07-07 NOTE — Assessment & Plan Note (Signed)
Summary: CPX/CLE   Vital Signs:  Patient profile:   55 year old female Height:      65 inches Weight:      183.75 pounds BMI:     30.69 Temp:     97.7 degrees F oral Pulse rate:   72 / minute Pulse rhythm:   regular BP sitting:   102 / 60  (left arm) Cuff size:   large  Vitals Entered By: Linde Gillis CMA Duncan Dull) (June 29, 2010 2:36 PM) CC: complete physical, no pap, Hypertension Management, Lipid Management   History of Present Illness: The patient is here for annual wellness exam and preventative care.     06/2009 saw Cards for dyspnea.Marland Kitchenecho 12/10: EF 55-60% normal. Myoview 12/10 EF 83% normal. Dyspnea much improved. No orthopnea, PND. Mild dependent edema. Still with mild palpitations but improved. No syncpe or presyncope. Walking 1 mile on occasion. per Card...dyspnea due to weight and deconditioning.   HTN, well controlled on losartan. Occ dizziness. Not checking at home. But wsee phone note for lower BPs in 12/2009. 12 more lb weight loss  High cholesterol at goal on Vytorin...lower than ever in past. pt interested in stopping medication.   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  Negative major cardiovascular risk factors include female age less than 58 years old and non-tobacco-user status.    Lipid Management History:      Positive NCEP/ATP III risk factors include hypertension.  Negative NCEP/ATP III risk factors include female age less than 55 years old and non-tobacco-user status.        The patient states that she knows about the "Therapeutic Lifestyle Change" diet.  Her compliance with the TLC diet is good.  The patient expresses understanding of adjunctive measures for cholesterol lowering.  Adjunctive measures started by the patient include aerobic exercise.      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 Take 1 Tablet By Mouth Once A Day 2)  Losartan Potassium-Hctz 50-12.5 Mg Tabs (Losartan Potassium-Hctz) .... Take 1 Tablet By Mouth Once A Day 3)  Estrace 0.5 Mg Tabs (Estradiol) .... Take 1 Tablet By Mouth Once A Day 4)  Fluticasone Propionate 50 Mcg/act Susp (Fluticasone Propionate) .... One Spray Per Nostril Daily 5)  Glucosamine-Chondroitin   Caps (Glucosamine-Chondroit-Vit C-Mn) .... Take 1 Tablet By Mouth Once A Day 6)  Multivitamins   Tabs (Multiple Vitamin) .... Take 1 Tablet By Mouth Once A Day 7)  Vitamin E 600 Unit  Caps (Vitamin E) .... Take 1 Capsule By Mouth Once A Day 8)  Aspirin 81 Mg Tbec (Aspirin) .... Take One Tablet By Mouth Daily 9)  Metoprolol Succinate 50 Mg Xr24h-Tab (Metoprolol Succinate) .... Take One Tablet By Mouth Daily 10)  Fish Oil 1000 Mg Caps (Omega-3 Fatty Acids) .... Take One Tablet By Mouth Daily 11)  Vitamin E 400  Unit Chew (Vitamin E) .... Take One Tablet By Mouth Daily 12)  Vitamin B-12 1000 Mcg Tabs (Cyanocobalamin) .... Take One Tablet By Mouth Daily 13)  Vitamin C 500 Mg Tabs (Ascorbic Acid) .... Take One Tablet By Mouth Daily 14)  Ferrous Sulfate 325 (65 Fe) Mg Tabs (Ferrous Sulfate) .... Take One Tablet By Mouth Daily  Allergies (verified): No Known Drug Allergies  Past History:  Past medical, surgical, family and social histories (including risk factors) reviewed for relevance to current acute and  chronic problems.  Past Medical History: Reviewed history from 07/02/2009 and no changes required. Osteoarthritis GERD Hyperlipidemia Hypertension Dyspnea    --echo 12/10: EF 55-60% normal    --Myoview 12/10 EF 83%. normal  Past Surgical History: Reviewed history from 08/07/2008 and no changes required. 04/2007 bladder sling, total vaginal hysterectomy, no cervix 1986 misscarriage, Dand C 11/2006 Chest CT: pulmonary nodules right middle  Family History: Reviewed history from 04/15/2009 and no changes required. father: CAD, HTN, high chol  s/p re-do CABG died 60 CHF mother CAD, HTN, high chol, DM died 36 MI sister: MI < age 74 2 sisters: DM no cancer  Social History: Reviewed history from 04/15/2009 and no changes required. Occupation: Optician, dispensing Married 3 kids: asthma Former Smoker 15 pack year history quit 1989 Alcohol use-yes  1-2 beers daily Drug use-no Regular exercise-no Diet: druits and veggies  Review of Systems General:  Denies fatigue and fever. CV:  Denies chest pain or discomfort and swelling of feet. Resp:  Denies shortness of breath. GI:  Denies abdominal pain, bloody stools, constipation, and diarrhea. GU:  Denies dysuria. Derm:  Denies lesion(s).  Physical Exam  General:  overweight  appearing female in NAD Head:  Normocephalic and atraumatic without obvious abnormalities. No apparent alopecia or balding. Ears:  External ear exam shows no significant lesions or deformities.  Otoscopic examination reveals clear canals, tympanic membranes are intact bilaterally without bulging, retraction, inflammation or discharge. Hearing is grossly normal bilaterally. Nose:  External nasal examination shows no deformity or inflammation. Nasal mucosa are pink and moist without lesions or exudates. Mouth:  Oral mucosa and oropharynx without lesions or exudates.  Teeth in good repair. Neck:  no carotid bruit or thyromegaly no cervical or supraclavicular  lymphadenopathy  Breasts:  No mass, nodules, thickening, tenderness, bulging, retraction, inflamation, nipple discharge or skin changes noted.   Candidal infection.. under left breast.. covered in powder Lungs:  Normal respiratory effort, chest expands symmetrically. Lungs are clear to auscultation, no crackles or wheezes. Heart:  Normal rate and regular rhythm. S1 and S2 normal without gallop, murmur, click, rub or other extra sounds. Abdomen:  Bowel sounds positive,abdomen soft and non-tender without masses, organomegaly or hernias noted. Genitalia:  not indicated  Msk:  No deformity or scoliosis noted of thoracic or lumbar spine.   Pulses:  R and L posterior tibial pulses are full and equal bilaterally  Extremities:  no edema  Neurologic:  No cranial nerve deficits noted. Station and gait are normal. Plantar reflexes are down-going bilaterally. DTRs are symmetrical throughout. Sensory, motor and coordinative functions appear intact. Skin:  Intact without suspicious lesions or rashes Psych:  Cognition and judgment appear intact. Alert and cooperative with normal attention span and concentration. No apparent delusions, illusions, hallucinations   Impression & Recommendations:  Problem # 1:  PHYSICAL EXAMINATION (ICD-V70.0) The patient's preventative maintenance and recommended screening tests for an annual wellness exam were reviewed in full today. Brought up to date unless services declined.  Counselled on the importance of diet, exercise, and its role in overall health and mortality. The patient's FH and SH was reviewed, including their home life, tobacco status, and drug and alcohol status.     Problem # 2:  HYPERTENSION (ICD-401.9) Given low BPs and occ dizziness. Will try to reduce dose of med in half.  Her updated medication list for this problem includes:    Losartan Potassium 50 Mg Tabs (Losartan potassium) .Marland Kitchen... Take 1 tablet by mouth once a day    Metoprolol Succinate 50 Mg  Xr24h-tab (Metoprolol succinate) .Marland Kitchen... Take one tablet by mouth daily  Problem # 3:  HYPERLIPIDEMIA (ICD-272.4) Improved.. will try having he stop her med. Continue lifestyle changes. Great job! The following medications were removed from the medication list:    Vytorin 10-40 Mg Tabs (Ezetimibe-simvastatin) .Marland Kitchen... Take 1 tablet by mouth once a day  Problem # 4:  PREDIABETES (ICD-790.29) Resolved.   Complete Medication List: 1)  Losartan Potassium 50 Mg Tabs (Losartan potassium) .... Take 1 tablet by mouth once a day 2)  Estrace 0.5 Mg Tabs (Estradiol) .... Take 1 tablet by mouth once a day 3)  Fluticasone Propionate 50 Mcg/act Susp (Fluticasone propionate) .... One spray per nostril daily 4)  Glucosamine-chondroitin Caps (Glucosamine-chondroit-vit c-mn) .... Take 1 tablet by mouth once a day 5)  Multivitamins Tabs (Multiple vitamin) .... Take 1 tablet by mouth once a day 6)  Vitamin E 600 Unit Caps (Vitamin e) .... Take 1 capsule by mouth once a day 7)  Aspirin 81 Mg Tbec (Aspirin) .... Take one tablet by mouth daily 8)  Metoprolol Succinate 50 Mg Xr24h-tab (Metoprolol succinate) .... Take one tablet by mouth daily 9)  Fish Oil 1000 Mg Caps (Omega-3 fatty acids) .... Take one tablet by mouth daily 10)  Vitamin E 400 Unit Chew (Vitamin e) .... Take one tablet by mouth daily 11)  Vitamin B-12 1000 Mcg Tabs (Cyanocobalamin) .... Take one tablet by mouth daily 12)  Vitamin C 500 Mg Tabs (Ascorbic acid) .... Take one tablet by mouth daily 13)  Ferrous Sulfate 325 (65 Fe) Mg Tabs (Ferrous sulfate) .... Take one tablet by mouth daily  Other Orders: Gastroenterology Referral (GI)  Hypertension Assessment/Plan:      The patient's hypertensive risk group is category B: At least one risk factor (excluding diabetes) with no target organ damage.  Her calculated 10 year risk of coronary heart disease is 3 %.  Today's blood pressure is 102/60.  Her blood pressure goal is < 140/90.  Lipid  Assessment/Plan:      Based on NCEP/ATP III, the patient's risk factor category is "0-1 risk factors".  The patient's lipid goals are as follows: Total cholesterol goal is 200; LDL cholesterol goal is 160; HDL cholesterol goal is 40; Triglyceride goal is 150.  Her LDL cholesterol goal has been met.    Patient Instructions: 1)  Change losarttan/HCTZ to losartan alone at 50 mg daily. 2)   Follow BP every other day... call if BP remaining low and dizziness continuing or if BP high >140/90.  3)   Stop Vytorin.  4)  Recheck fasting LIPIDS  in 3 months Dx 272.0    5)  Please schedule a follow-up appointment in 6 months .  6)  Referral Appointment Information 7)  Day/Date: 8)  Time: 9)  Place/MD: 10)  Address: 11)  Phone/Fax: 12)  Patient given appointment information. Information/Orders faxed/mailed.  Prescriptions: LOSARTAN POTASSIUM 50 MG TABS (LOSARTAN POTASSIUM) Take 1 tablet  by mouth once a day  #30 x 11   Entered and Authorized by:   Kerby Nora MD   Signed by:   Kerby Nora MD on 06/29/2010   Method used:   Electronically to        [facility]* (retail)       [id number]       [place], [place]  [address]       Ph: [phone]       Fax: [phone]   RxID:   [id number]    Orders Added: 1)  Gastroenterology Referral [GI] 2)  Est. Patient 40-64 years [[address]]    Current Allergies (reviewed today): No known allergies   Last Flu Vaccine:  Fluvax 3+ (04/07/2009 10:39:04 AM) Flu Vaccine Result Date:  03/16/2010 Flu Vaccine Result:  given Flu Vaccine Next Due:  1 yr Last PAP:  Normal (11/14/2207 2:47:02 PM) PAP Next Due:  Not Indicated     Past Medical History:    Reviewed history from 07/02/2009 and no changes required:       Osteoarthritis       GERD       Hyperlipidemia       Hypertension       Dyspnea          --echo 12/10: EF 55-60% normal          --Myoview 12/10 EF 83%. normal  Past Surgical History:    Reviewed history from 08/07/2008 and no  changes required:       04/2007 bladder sling, total vaginal hysterectomy, no cervix       1986 misscarriage, Dand C       11/2006 Chest CT: pulmonary nodules right middle

## 2010-07-22 NOTE — Procedures (Addendum)
Summary: Colonoscopy Report/[facility]  Colonoscopy Report/[facility]   Imported By: Maryln Gottron 07/12/2010 12:30:26  _____________________________________________________________________  External Attachment:    Type:   Image     Comment:   External Document  Appended Document: Orders Update  NO specific recomendations made on colonoscopy procedure report.. polyps seen... need to obtain path report as well to determine surviellience recommendations... per pt told verbally by previous MD to repeat in 2012.  Kerby Nora MD  July 12, 2010 12:47 PM   Clinical Lists Changes

## 2010-09-22 ENCOUNTER — Ambulatory Visit (AMBULATORY_SURGERY_CENTER): Payer: BC Managed Care – PPO | Admitting: *Deleted

## 2010-09-22 VITALS — Ht 64.0 in | Wt 180.9 lb

## 2010-09-22 DIAGNOSIS — Z1211 Encounter for screening for malignant neoplasm of colon: Secondary | ICD-10-CM

## 2010-09-22 MED ORDER — PEG-KCL-NACL-NASULF-NA ASC-C 100 G PO SOLR: ORAL | Status: DC

## 2010-09-22 MED ORDER — ASPIRIN EC 81 MG PO TBEC: 81.0000 mg | DELAYED_RELEASE_TABLET | Freq: Every day | ORAL | Status: AC

## 2010-09-27 ENCOUNTER — Other Ambulatory Visit (INDEPENDENT_AMBULATORY_CARE_PROVIDER_SITE_OTHER): Payer: BC Managed Care – PPO

## 2010-09-27 ENCOUNTER — Telehealth: Payer: Self-pay | Admitting: *Deleted

## 2010-09-27 DIAGNOSIS — E7800 Pure hypercholesterolemia, unspecified: Secondary | ICD-10-CM

## 2010-09-27 LAB — LDL CHOLESTEROL, DIRECT: Direct LDL: 160.1 mg/dL

## 2010-09-27 LAB — LIPID PANEL
HDL: 60.6 mg/dL (ref >39.00)
Total CHOL/HDL Ratio: 4
VLDL: 19.2 mg/dL (ref 0.0–40.0)

## 2010-09-27 NOTE — Progress Notes (Signed)
Addended by: Melody Comas on: 09/27/2010 08:14 AM   Modules accepted: Orders

## 2010-09-27 NOTE — Telephone Encounter (Signed)
Pt has brought in a list of her BP and pulse readings, List is on your desk.

## 2010-09-28 NOTE — Telephone Encounter (Signed)
Notify pt that BPs are well controlled and running in nml range. See scanned in sheet. Continue lower dose of medicaiton.

## 2010-09-29 NOTE — Telephone Encounter (Signed)
Patient advised.

## 2010-10-05 ENCOUNTER — Ambulatory Visit (AMBULATORY_SURGERY_CENTER): Payer: BC Managed Care – PPO | Admitting: Internal Medicine

## 2010-10-05 ENCOUNTER — Encounter: Payer: Self-pay | Admitting: Internal Medicine

## 2010-10-05 VITALS — BP 145/66 | HR 45 | Temp 97.4°F | Resp 18 | Ht 64.75 in | Wt 178.0 lb

## 2010-10-05 DIAGNOSIS — Z86010 Personal history of colonic polyps: Secondary | ICD-10-CM

## 2010-10-05 DIAGNOSIS — Z1211 Encounter for screening for malignant neoplasm of colon: Secondary | ICD-10-CM

## 2010-10-05 DIAGNOSIS — D1330 Benign neoplasm of unspecified part of small intestine: Secondary | ICD-10-CM

## 2010-10-05 DIAGNOSIS — D126 Benign neoplasm of colon, unspecified: Secondary | ICD-10-CM

## 2010-10-05 DIAGNOSIS — K635 Polyp of colon: Secondary | ICD-10-CM

## 2010-10-05 MED ORDER — SODIUM CHLORIDE 0.9 % IV SOLN: 500.0000 mL | INTRAVENOUS | Status: DC

## 2010-10-05 NOTE — Patient Instructions (Addendum)
Resume all medications. Information on polyps given.

## 2010-10-06 ENCOUNTER — Encounter: Payer: Self-pay | Admitting: *Deleted

## 2010-10-06 ENCOUNTER — Telehealth: Payer: Self-pay

## 2010-10-06 NOTE — Telephone Encounter (Signed)
No ID on answering machine. 

## 2010-10-14 ENCOUNTER — Other Ambulatory Visit: Payer: Self-pay | Admitting: *Deleted

## 2010-10-14 MED ORDER — ESTRADIOL 0.5 MG PO TABS: 0.5000 mg | ORAL_TABLET | Freq: Every day | ORAL | Status: DC

## 2010-11-05 ENCOUNTER — Other Ambulatory Visit: Payer: Managed Care, Other (non HMO) | Admitting: Internal Medicine

## 2010-12-07 ENCOUNTER — Encounter: Payer: Self-pay | Admitting: Internal Medicine

## 2010-12-20 ENCOUNTER — Ambulatory Visit: Payer: Managed Care, Other (non HMO) | Admitting: Family Medicine

## 2010-12-28 ENCOUNTER — Ambulatory Visit: Payer: Managed Care, Other (non HMO) | Admitting: Family Medicine

## 2010-12-28 ENCOUNTER — Telehealth: Payer: Self-pay | Admitting: Family Medicine

## 2010-12-28 ENCOUNTER — Other Ambulatory Visit (INDEPENDENT_AMBULATORY_CARE_PROVIDER_SITE_OTHER): Payer: BC Managed Care – PPO | Admitting: Family Medicine

## 2010-12-28 DIAGNOSIS — I10 Essential (primary) hypertension: Secondary | ICD-10-CM

## 2010-12-28 DIAGNOSIS — E785 Hyperlipidemia, unspecified: Secondary | ICD-10-CM

## 2010-12-28 DIAGNOSIS — R7309 Other abnormal glucose: Secondary | ICD-10-CM

## 2010-12-28 LAB — COMPREHENSIVE METABOLIC PANEL
Albumin: 3.6 g/dL (ref 3.5–5.2)
BUN: 17 mg/dL (ref 6–23)
Calcium: 8.8 mg/dL (ref 8.4–10.5)
Chloride: 104 mEq/L (ref 96–112)
Creatinine, Ser: 0.6 mg/dL (ref 0.4–1.2)
GFR: 108.04 mL/min (ref >60.00)
Glucose, Bld: 97 mg/dL (ref 70–99)
Potassium: 4.1 mEq/L (ref 3.5–5.1)

## 2010-12-28 LAB — LIPID PANEL
Cholesterol: 276 mg/dL — ABNORMAL HIGH (ref 0–200)
Triglycerides: 122 mg/dL (ref 0.0–149.0)

## 2010-12-28 NOTE — Telephone Encounter (Signed)
Message copied by Kerby Nora E on Tue Dec 28, 2010  1:22 AM ------      Message from: Liane Comber C      Created: Wed Dec 22, 2010 10:17 AM      Regarding: 6 month f/u labs tues 8/14       Please order  future labs for pt's upcomming lab appt.      Thanks      Rodney Booze

## 2010-12-30 ENCOUNTER — Encounter: Payer: Self-pay | Admitting: Family Medicine

## 2010-12-30 ENCOUNTER — Ambulatory Visit (INDEPENDENT_AMBULATORY_CARE_PROVIDER_SITE_OTHER): Payer: BC Managed Care – PPO | Admitting: Family Medicine

## 2010-12-30 DIAGNOSIS — Z8679 Personal history of other diseases of the circulatory system: Secondary | ICD-10-CM

## 2010-12-30 DIAGNOSIS — I10 Essential (primary) hypertension: Secondary | ICD-10-CM

## 2010-12-30 DIAGNOSIS — R7309 Other abnormal glucose: Secondary | ICD-10-CM

## 2010-12-30 DIAGNOSIS — E785 Hyperlipidemia, unspecified: Secondary | ICD-10-CM

## 2010-12-30 MED ORDER — SIMVASTATIN 20 MG PO TABS: 20.0000 mg | ORAL_TABLET | Freq: Every day | ORAL | Status: DC

## 2010-12-30 NOTE — Assessment & Plan Note (Signed)
Worsened control.. Not exercising  And poor vacation diet.  Planning on getting back on track. Also off Vytorin given was very low in past... Will restart with generic simvastatin low dose. Recheck in 3 months.

## 2010-12-30 NOTE — Assessment & Plan Note (Signed)
Resolved

## 2010-12-30 NOTE — Assessment & Plan Note (Signed)
Stable on metoprolol. °

## 2010-12-30 NOTE — Patient Instructions (Addendum)
If feeling lightheaded check BP.. Call and let us know if running < 90/60, or consistently. We could consider decreasing losartan to 25 mg daily.  Return to regular exercise routine. Work on healthy eating habits.  Lab eval in 3 months.  Next follow is CPX in about 6 months or after I return.

## 2010-12-30 NOTE — Assessment & Plan Note (Signed)
BP well controlled. Still some occ lightheadedness. If low BPs associated at home.. Can consider decreasing losartan to 25 mg daily.

## 2010-12-30 NOTE — Progress Notes (Signed)
  Subjective:    Patient ID: Bianca Hogan, female    DOB: 09-29-1955, 55 y.o.   MRN: [id number]  HPI Hypertension:  Well controlled on losartan , metoprolol  Using medication without problems or lightheadedness:  Chest pain with exertion: None, had myoview last year. Edema:Occ after eating salt. Short of breath: None Average home BPs: Not checking. Other issues: Minimal exercise in last few weeks.  Elevated Cholesterol: Much worsened control off Vytorin (LDL was 73 on this med)  LDL >200! Goal <100-130. On fish oil. Using medications without problems: Had stopped Vytorin due to wanting to be off med, no SE associated.  Prediabetes: well controlled with diet.. Glucose 97.    Review of Systems  Constitutional: Negative for fever and fatigue.  HENT: Negative for ear pain.   Eyes: Negative for pain.  Respiratory: Negative for chest tightness and shortness of breath.   Cardiovascular: Negative for chest pain, palpitations and leg swelling.  Gastrointestinal: Negative for abdominal pain.  Genitourinary: Negative for dysuria.  Neurological: Positive for light-headedness.       Occ        Objective:   Physical Exam  Constitutional: Vital signs are normal. She appears well-developed and well-nourished. She is cooperative.  Non-toxic appearance. She does not appear ill. No distress.  HENT:  Head: Normocephalic.  Right Ear: Hearing, tympanic membrane, external ear and ear canal normal. Tympanic membrane is not erythematous, not retracted and not bulging.  Left Ear: Hearing, tympanic membrane, external ear and ear canal normal. Tympanic membrane is not erythematous, not retracted and not bulging.  Nose: No mucosal edema or rhinorrhea. Right sinus exhibits no maxillary sinus tenderness and no frontal sinus tenderness. Left sinus exhibits no maxillary sinus tenderness and no frontal sinus tenderness.  Mouth/Throat: Uvula is midline, oropharynx is clear and moist and mucous membranes are  normal.  Eyes: Conjunctivae, EOM and lids are normal. Pupils are equal, round, and reactive to light. No foreign bodies found.  Neck: Trachea normal and normal range of motion. Neck supple. Carotid bruit is not present. No mass and no thyromegaly present.  Cardiovascular: Normal rate, regular rhythm, S1 normal, S2 normal, normal heart sounds, intact distal pulses and normal pulses.  Exam reveals no gallop and no friction rub.   No murmur heard. Pulmonary/Chest: Effort normal and breath sounds normal. Not tachypneic. No respiratory distress. She has no decreased breath sounds. She has no wheezes. She has no rhonchi. She has no rales.  Abdominal: Soft. Normal appearance and bowel sounds are normal. There is no tenderness.  Neurological: She is alert.  Skin: Skin is warm, dry and intact. No rash noted.  Psychiatric: Her speech is normal and behavior is normal. Judgment and thought content normal. Her mood appears not anxious. Cognition and memory are normal. She does not exhibit a depressed mood.          Assessment & Plan:

## 2011-01-13 ENCOUNTER — Other Ambulatory Visit: Payer: Self-pay | Admitting: *Deleted

## 2011-01-13 MED ORDER — METOPROLOL SUCCINATE ER 50 MG PO TB24: 50.0000 mg | ORAL_TABLET | Freq: Every day | ORAL | Status: DC

## 2011-01-22 ENCOUNTER — Ambulatory Visit (INDEPENDENT_AMBULATORY_CARE_PROVIDER_SITE_OTHER): Payer: BC Managed Care – PPO | Admitting: Family Medicine

## 2011-01-22 VITALS — BP 124/78 | HR 62 | Temp 97.9°F | Wt 183.0 lb

## 2011-01-22 DIAGNOSIS — I10 Essential (primary) hypertension: Secondary | ICD-10-CM

## 2011-01-22 DIAGNOSIS — J40 Bronchitis, not specified as acute or chronic: Secondary | ICD-10-CM

## 2011-01-22 MED ORDER — AZITHROMYCIN 250 MG PO TABS: ORAL_TABLET | ORAL | Status: AC

## 2011-01-22 NOTE — Patient Instructions (Addendum)
Bronchitis Bronchitis is the body's way of reacting to injury and/or infection (inflammation) of the bronchi. Bronchi are the air tubes that extend from the windpipe into the lungs. If the inflammation becomes severe, it may cause shortness of breath.  CAUSES Inflammation may be caused by:  A virus.   Germs (bacteria).   Dust.   Allergens.   Pollutants and many other irritants.  The cells lining the bronchial tree are covered with tiny hairs (cilia). These constantly beat upward, away from the lungs, toward the mouth. This keeps the lungs free of pollutants. When these cells become too irritated and are unable to do their job, mucus begins to develop. This causes the characteristic cough of bronchitis. The cough clears the lungs when the cilia are unable to do their job. Without either of these protective mechanisms, the mucus would settle in the lungs. Then you would develop pneumonia. Smoking is a common cause of bronchitis and can contribute to pneumonia. Stopping this habit is the single most important thing you can do to help yourself. TREATMENT  Your caregiver may prescribe an antibiotic if the cough is caused by bacteria. Also, medicines that open up your airways make it easier to breathe. Your caregiver may also recommend or prescribe an expectorant. It will loosen the mucus to be coughed up. Only take over-the-counter or prescription medicines for pain, discomfort, or fever as directed by your caregiver.   Removing whatever causes the problem (smoking, for example) is critical to preventing the problem from getting worse.   Cough suppressants may be prescribed for relief of cough symptoms.   Inhaled medicines may be prescribed to help with symptoms now and to help prevent problems from returning.   For those with recurrent (chronic) bronchitis, there may be a need for steroid medicines.  SEEK IMMEDIATE MEDICAL CARE IF:  During treatment, you develop more pus-like mucus  (purulent sputum).   You or your child has an oral temperature above 103, not controlled by medicine.   Your baby is older than 3 months with a rectal temperature of 102 F (38.9 C) or higher.   Your baby is 78 months old or younger with a rectal temperature of 100.4 F (38 C) or higher.   You become progressively more ill.   You have increased difficulty breathing, wheezing, or shortness of breath.  It is necessary to seek immediate medical care if you are elderly or sick from any other disease. MAKE SURE YOU:  Understand these instructions.   Will watch your condition.   Will get help right away if you are not doing well or get worse.  Document Released: 05/02/2005 Document Re-Released: 07/27/2009 [facility] Patient Information 2011 [place], [place].   Warm tea lemon and honey for cough. Witch Hazel Astringent for skin Mucinex  Twice a day for 10 days Probiotic such as Align caps and yogurt daily

## 2011-01-23 ENCOUNTER — Encounter: Payer: Self-pay | Admitting: Family Medicine

## 2011-01-23 DIAGNOSIS — J40 Bronchitis, not specified as acute or chronic: Secondary | ICD-10-CM

## 2011-01-23 HISTORY — DX: Bronchitis, not specified as acute or chronic: J40

## 2011-01-23 NOTE — Assessment & Plan Note (Signed)
Well controlled on current meds no changes today 

## 2011-01-23 NOTE — Assessment & Plan Note (Signed)
Patient is noting several days now of sinus pressure and rhinorrhea. Worsening cough and shortness of breath. Postnasal drip, headache, chills and malaise. She denies chest pain, palpitations, shortness of breath, GI or GU concerns at this time. She has not tried any over-the-counter medications as for her to control her symptoms. We will treat her with a Z-Pak, Mucinex she will report if symptoms do not improve. She is mildly dehydrated on exam increase po fluids

## 2011-01-23 NOTE — Progress Notes (Signed)
Bianca Hogan [id number] Oct 05, 1955 01/23/2011      Progress Note-Follow Up  Subjective  Chief Complaint  Chief Complaint  Patient presents with  . Sinusitis  . Sore Throat    HPI  Patient is a 55 year old Caucasian female who is in today for several days now worsening respiratory symptoms. She notes a sore throat, postnasal drip, cough, malaise, headache, rhinorrhea, chills. She denies any chest pain, palpitations, shortness of breath. She acknowledges not eating well but denies anorexia. Has not tried any over-the-counter medications.  Past Medical History  Diagnosis Date  . Allergy   . Hypertension   . Shortness of breath     dyspenea; echo 12/10 EF 55-60%, nml; myoview 12/10 EF 83%, nml  . HLD (hyperlipidemia)   . GERD (gastroesophageal reflux disease)   . Osteoarthritis   . Bronchitis 01/23/2011    Past Surgical History  Procedure Date  . Colonoscopy   . Polypectomy   . Vaginal hysterectomy     no cervix  . Cystocele repair   . Tonsillectomy   . Bladder suspension 12/08  . Dilation and curettage of uterus 1986    miscarriage   . Chest ct 7/08    pulmonary nodule Rt middle     Family History  Problem Relation Age of Onset  . Cancer Neg Hx     History   Social History  . Marital Status: Married    Spouse Name: N/A    Number of Children: N/A  . Years of Education: N/A   Occupational History  . Not on file.   Social History Main Topics  . Smoking status: Former Smoker    Quit date: 05/25/1987  . Smokeless tobacco: Never Used  . Alcohol Use: 7.2 oz/week    12 Glasses of wine per week     1-2 beers daily  . Drug Use: No  . Sexually Active: Not on file   Other Topics Concern  . 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 Does not get regular exercise; diet - fruits and veggies. Pt signed designated party release signed 12/08/09 appointing Fayne Mediate. May leave msg on home machine.     Current  Outpatient Prescriptions on File Prior to Visit  Medication Sig Dispense Refill  . aspirin EC 81 MG tablet Take 1 tablet (81 mg total) by mouth daily.  150 tablet  2  . Cyanocobalamin (VITAMIN B-12 PO) Take 1 tablet by mouth daily.        Marland Kitchen estradiol (ESTRACE) 0.5 MG tablet Take 1 tablet (0.5 mg total) by mouth daily.  90 tablet  3  . ferrous sulfate 325 (65 FE) MG tablet Take 325 mg by mouth daily with breakfast.        . fish oil-omega-3 fatty acids 1000 MG capsule Take 2 g by mouth daily.        . fluticasone (FLONASE) 50 MCG/ACT nasal spray 2 sprays by Nasal route daily.        . Glucosamine-Chondroit-Vit C-Mn CAPS Take 2 capsules by mouth daily. Takes two  daily       . losartan (COZAAR) 50 MG tablet Take 50 mg by mouth daily.        . metoprolol (TOPROL-XL) 50 MG 24 hr tablet Take 1 tablet (50 mg total) by mouth daily.  30 tablet  10  . Multiple Vitamins-Minerals (MULTIVITAMIN WITH MINERALS) tablet Take 1 tablet by mouth daily.        Marland Kitchen  simvastatin (ZOCOR) 20 MG tablet Take 1 tablet (20 mg total) by mouth at bedtime.  30 tablet  11  . VITAMIN C, CALCIUM ASCORBATE, PO Take by mouth daily.        . vitamin E 100 UNIT capsule Take 100 Units by mouth daily. Takes two tablets        Current Facility-Administered Medications on File Prior to Visit  Medication Dose Route Frequency Provider Last Rate Last Dose  . 0.9 %  sodium chloride infusion  500 mL Intravenous Continuous Yancey Flemings, MD        Allergies  Allergen Reactions  . Lisinopril Cough    Review of Systems  Review of Systems  Constitutional: Positive for chills. Negative for fever and malaise/fatigue.  HENT: Positive for congestion and sore throat. Negative for ear pain.   Eyes: Negative for discharge.  Respiratory: Positive for cough and sputum production. Negative for hemoptysis, shortness of breath and wheezing.   Cardiovascular: Negative for chest pain, palpitations and leg swelling.  Gastrointestinal: Negative for  nausea, abdominal pain and diarrhea.  Genitourinary: Negative for dysuria.  Musculoskeletal: Negative for falls.  Skin: Negative for rash.  Neurological: Positive for headaches. Negative for loss of consciousness.  Endo/Heme/Allergies: Negative for polydipsia.  Psychiatric/Behavioral: Negative for depression and suicidal ideas. The patient is not nervous/anxious and does not have insomnia.     Objective  BP 124/78  Pulse 62  Temp(Src) 97.9 F (36.6 C) (Oral)  Wt 183 lb (83.008 kg)  SpO2 96%  Physical Exam  Physical Exam  Constitutional: She is oriented to person, place, and time and well-developed, well-nourished, and in no distress. No distress.  HENT:  Head: Normocephalic and atraumatic.  Eyes: Conjunctivae are normal.  Neck: Neck supple. No thyromegaly present.  Cardiovascular: Normal rate, regular rhythm and normal heart sounds.   No murmur heard. Pulmonary/Chest: Effort normal and breath sounds normal. She has no wheezes.  Abdominal: She exhibits no distension and no mass.  Musculoskeletal: She exhibits no edema.  Lymphadenopathy:    She has no cervical adenopathy.  Neurological: She is alert and oriented to person, place, and time.  Skin: Skin is warm and dry. No rash noted. She is not diaphoretic.  Psychiatric: Memory, affect and judgment normal.    No results found for this basename: TSH   No results found for this basename: WBC, HGB, HCT, MCV, PLT   Lab Results  Component Value Date   CREATININE 0.6 12/28/2010   BUN 17 12/28/2010   NA 138 12/28/2010   K 4.1 12/28/2010   CL 104 12/28/2010   CO2 27 12/28/2010   Lab Results  Component Value Date   ALT 20 12/28/2010   AST 18 12/28/2010   ALKPHOS 71 12/28/2010   BILITOT 0.6 12/28/2010   Lab Results  Component Value Date   CHOL 276* 12/28/2010   Lab Results  Component Value Date   HDL 73.30 12/28/2010   Lab Results  Component Value Date   LDLCALC 73 06/24/2010   Lab Results  Component Value Date   TRIG  122.0 12/28/2010   Lab Results  Component Value Date   CHOLHDL 4 12/28/2010     Assessment & Plan  HYPERTENSION Well controlled on current meds no changes today  Bronchitis Patient is noting several days now of sinus pressure and rhinorrhea. Worsening cough and shortness of breath. Postnasal drip, headache, chills and malaise. She denies chest pain, palpitations, shortness of breath, GI or GU concerns at this time. She  has not tried any over-the-counter medications as for her to control her symptoms. We will treat her with a Z-Pak, Mucinex she will report if symptoms do not improve. She is mildly dehydrated on exam increase po fluids

## 2011-01-31 ENCOUNTER — Ambulatory Visit (INDEPENDENT_AMBULATORY_CARE_PROVIDER_SITE_OTHER): Payer: BC Managed Care – PPO | Admitting: Family Medicine

## 2011-01-31 ENCOUNTER — Encounter: Payer: Self-pay | Admitting: Family Medicine

## 2011-01-31 DIAGNOSIS — M25539 Pain in unspecified wrist: Secondary | ICD-10-CM

## 2011-01-31 NOTE — Patient Instructions (Signed)
I think you have wrist sprain. Treat with ibuprofen or advil 1-2 pills at a time 3 times a day for next 3 days then just as needed (use with food). Wrist brace provided today. Update [place] if any worsening or not improving as expected. Good to see you today, call us with questions.

## 2011-01-31 NOTE — Assessment & Plan Note (Signed)
Mild tenderness on exam today. ?wrist sprain vs extensor tendonitis.  Treat with wrist brace, NSAIDs, and stretching exercises provided from [facility] on wrist sprain. Update [place] if not improving as expected or any worsening. Pt agrees with plan.

## 2011-01-31 NOTE — Progress Notes (Signed)
  Subjective:    Patient ID: Bianca Hogan, female    DOB: [birth year], 55 y.o.   MRN: [id number]  HPI CC: R wrist pain  1 wk h/o R wrist pain.  Denies inciting trauma/injury.  Pain described more dorsal aspect, then travels medial dorsal part of forearm.  Worsened with certain movements (ie writing, pushing something with index finger).    Hasn't tried anything for this.  Not constant.    Denies tingling or numbness.  No radiation up arm or into fingers.  Mild weakness in wrist, not in hand.  H/o tennis elbow bilateral elbows 1990s.  Uses band.  No other joint pains.  No redness, swelling or warmth.  No fevers/chills.  Would like flu shot today, finished zpack on Thursday for bronchitis.  Prior had flu.  Review of Systems Per HPI    Objective:   Physical Exam  Nursing note and vitals reviewed. Constitutional: She appears well-developed and well-nourished. No distress.  Cardiovascular:  Pulses:      Radial pulses are 2+ on the right side, and 2+ on the left side.  Musculoskeletal:       Right wrist: She exhibits tenderness. She exhibits normal range of motion, no bony tenderness, no swelling and no effusion.       Left wrist: Normal.       Arms:      R wrist: Negative finkelstein, neg phalen, tinel. Grip strength intact, strength with flexion, medial and lateral rotation, extension against resistance normal. Mild tenderness at radial styloid and slightly distal to radial styloid. No pain at TFCC. No pain at scaphoid. No evidence of ganglion.  Neurological: She has normal strength. No sensory deficit.  Skin: Skin is warm and dry. No rash noted. No erythema.          Assessment & Plan:

## 2011-02-01 ENCOUNTER — Other Ambulatory Visit: Payer: Self-pay | Admitting: Family Medicine

## 2011-02-01 DIAGNOSIS — Z1231 Encounter for screening mammogram for malignant neoplasm of breast: Secondary | ICD-10-CM

## 2011-02-07 ENCOUNTER — Ambulatory Visit
Admission: RE | Admit: 2011-02-07 | Discharge: 2011-02-07 | Disposition: A | Discharge status: Home / Self Care | Payer: BC Managed Care – PPO | Source: Ambulatory Visit | Attending: Family Medicine | Admitting: Family Medicine

## 2011-02-07 DIAGNOSIS — Z1231 Encounter for screening mammogram for malignant neoplasm of breast: Secondary | ICD-10-CM

## 2011-03-29 ENCOUNTER — Other Ambulatory Visit (INDEPENDENT_AMBULATORY_CARE_PROVIDER_SITE_OTHER): Payer: BC Managed Care – PPO

## 2011-03-29 DIAGNOSIS — E785 Hyperlipidemia, unspecified: Secondary | ICD-10-CM

## 2011-03-29 LAB — COMPREHENSIVE METABOLIC PANEL
Albumin: 3.5 g/dL (ref 3.5–5.2)
BUN: 14 mg/dL (ref 6–23)
CO2: 28 mEq/L (ref 19–32)
GFR: 93.63 mL/min (ref >60.00)
Glucose, Bld: 90 mg/dL (ref 70–99)
Sodium: 140 mEq/L (ref 135–145)
Total Bilirubin: 0.6 mg/dL (ref 0.3–1.2)
Total Protein: 6.3 g/dL (ref 6.0–8.3)

## 2011-03-29 LAB — LIPID PANEL: Cholesterol: 192 mg/dL (ref 0–200)

## 2011-03-30 ENCOUNTER — Encounter: Payer: Self-pay | Admitting: *Deleted

## 2011-06-17 ENCOUNTER — Telehealth: Payer: Self-pay | Admitting: Family Medicine

## 2011-06-17 DIAGNOSIS — E785 Hyperlipidemia, unspecified: Secondary | ICD-10-CM

## 2011-06-17 DIAGNOSIS — R7309 Other abnormal glucose: Secondary | ICD-10-CM

## 2011-06-17 DIAGNOSIS — I10 Essential (primary) hypertension: Secondary | ICD-10-CM

## 2011-06-17 NOTE — Telephone Encounter (Signed)
Message copied by Excell Seltzer on Fri Jun 17, 2011 12:21 PM ------      Message from: Alvina Chou      Created: Thu Jun 16, 2011 11:54 AM       Patient is scheduled for CPX labs, please order future labs, Thanks , Bianca Hogan

## 2011-06-29 ENCOUNTER — Encounter: Payer: Self-pay | Admitting: Family Medicine

## 2011-06-29 ENCOUNTER — Ambulatory Visit (INDEPENDENT_AMBULATORY_CARE_PROVIDER_SITE_OTHER): Payer: BC Managed Care – PPO | Admitting: Family Medicine

## 2011-06-29 DIAGNOSIS — B372 Candidiasis of skin and nail: Secondary | ICD-10-CM

## 2011-06-29 DIAGNOSIS — J069 Acute upper respiratory infection, unspecified: Secondary | ICD-10-CM

## 2011-06-29 DIAGNOSIS — H6690 Otitis media, unspecified, unspecified ear: Secondary | ICD-10-CM

## 2011-06-29 MED ORDER — FLUCONAZOLE 150 MG PO TABS: 150.0000 mg | ORAL_TABLET | Freq: Once | ORAL | Status: AC

## 2011-06-29 MED ORDER — KETOCONAZOLE 2 % EX CREA: TOPICAL_CREAM | Freq: Two times a day (BID) | CUTANEOUS | Status: AC

## 2011-06-29 MED ORDER — AMOXICILLIN 500 MG PO CAPS: 1000.0000 mg | ORAL_CAPSULE | Freq: Two times a day (BID) | ORAL | Status: AC

## 2011-06-29 NOTE — Progress Notes (Signed)
  Patient Name: Bianca Hogan Date of Birth: 11-12-1955 Age: 56 y.o. Medical Record Number: [id number] Gender: female Date of Encounter: 06/29/2011  History of Present Illness:  Bianca Hogan is a 56 y.o. very pleasant female patient who presents with the following:  Right ear is hurting a lot.  Draining today.  A couple of days with some congestion --- ? Cold.  R ear pain and fullness.  Some nasal congestion, stuffiness No fever, chills, sweats No n/v/d  Candida: chronic candida under breast - needs topical med refill  Past Medical History, Surgical History, Social History, Family History, Problem List, Medications, and Allergies have been reviewed and updated if relevant.  Review of Systems: ROS: GEN: Acute illness details above GI: Tolerating PO intake GU: maintaining adequate hydration and urination Pulm: No SOB Interactive and getting along well at home.  Otherwise, ROS is as per the HPI.   Physical Examination: Filed Vitals:   06/29/11 1027  BP: 120/86  Pulse: 75  Temp: 98 F (36.7 C)  TempSrc: Oral  Height: 5' 4.75" (1.645 m)  Weight: 187 lb (84.823 kg)  SpO2: 98%    Body mass index is 31.36 kg/(m^2).   Gen: WDWN, NAD; A & O x3, cooperative. Pleasant.Globally Non-toxic HEENT: Normocephalic and atraumatic. Throat clear, w/o exudate, R TM diffusely bulging, reddened with pus behind TM, L TM - good landmarks, No fluid present. rhinnorhea.  MMM Frontal sinuses: NT Max sinuses: NT NECK: Anterior cervical  LAD is absent CV: RRR, No M/G/R, cap refill <2 sec PULM: Breathing comfortably in no respiratory distress. no wheezing, crackles, rhonchi EXT: No c/c/e PSYCH: Friendly, good eye contact MSK: Nml gait    Assessment and Plan: 1. Otitis media  amoxicillin (AMOXIL) 500 MG capsule, fluconazole (DIFLUCAN) 150 MG tablet  2. URI (upper respiratory infection)    3. Candidal skin infection  ketoconazole (NIZORAL) 2 % cream

## 2011-07-05 ENCOUNTER — Telehealth: Payer: Self-pay | Admitting: Family Medicine

## 2011-07-05 NOTE — Telephone Encounter (Signed)
Call  It is not plugged, but there may be a muffled sensation --- she should finish her antibiotics. If still having problems this time next week, we can have her see one of the ENT's

## 2011-07-05 NOTE — Telephone Encounter (Signed)
Patient advised.

## 2011-07-05 NOTE — Telephone Encounter (Signed)
Left a message for patient to return my call. 

## 2011-07-05 NOTE — Telephone Encounter (Signed)
Patient was seen on 06/29/11.  She has a ruptured ear.  Patient said her ear isn't draining anymore,but her ear is still plugged.  Patient is taking Mucinex and Amoxicillin.  Patient wants to know if her ear should still be plugged.  Patient wants to know if she needs to be seen again or if this is something she should wait out.

## 2011-07-11 ENCOUNTER — Other Ambulatory Visit: Payer: Self-pay | Admitting: *Deleted

## 2011-07-11 ENCOUNTER — Telehealth: Payer: Self-pay | Admitting: Family Medicine

## 2011-07-11 DIAGNOSIS — H6690 Otitis media, unspecified, unspecified ear: Secondary | ICD-10-CM

## 2011-07-11 DIAGNOSIS — H9190 Unspecified hearing loss, unspecified ear: Secondary | ICD-10-CM

## 2011-07-11 MED ORDER — LOSARTAN POTASSIUM 50 MG PO TABS: 50.0000 mg | ORAL_TABLET | Freq: Every day | ORAL | Status: DC

## 2011-07-11 NOTE — Telephone Encounter (Signed)
Pt still have problems w/ ear, not able to ear out of it. Would like referral to a ENT .Marland KitchenMarland KitchenMarland Kitchen

## 2011-08-09 ENCOUNTER — Other Ambulatory Visit: Payer: BC Managed Care – PPO

## 2011-08-18 ENCOUNTER — Encounter: Payer: BC Managed Care – PPO | Admitting: Family Medicine

## 2011-08-23 ENCOUNTER — Other Ambulatory Visit: Payer: Self-pay | Admitting: *Deleted

## 2011-08-23 MED ORDER — FLUTICASONE PROPIONATE 50 MCG/ACT NA SUSP: 2.0000 | Freq: Every day | NASAL | Status: DC

## 2011-09-12 ENCOUNTER — Other Ambulatory Visit: Payer: BC Managed Care – PPO

## 2011-09-13 ENCOUNTER — Other Ambulatory Visit (INDEPENDENT_AMBULATORY_CARE_PROVIDER_SITE_OTHER): Payer: BC Managed Care – PPO

## 2011-09-13 DIAGNOSIS — I10 Essential (primary) hypertension: Secondary | ICD-10-CM

## 2011-09-13 DIAGNOSIS — R7309 Other abnormal glucose: Secondary | ICD-10-CM

## 2011-09-13 DIAGNOSIS — E785 Hyperlipidemia, unspecified: Secondary | ICD-10-CM

## 2011-09-13 LAB — COMPREHENSIVE METABOLIC PANEL
CO2: 27 mEq/L (ref 19–32)
Creatinine, Ser: 0.6 mg/dL (ref 0.4–1.2)
GFR: 103.82 mL/min (ref >60.00)
Glucose, Bld: 93 mg/dL (ref 70–99)
Total Bilirubin: 0.8 mg/dL (ref 0.3–1.2)

## 2011-09-13 LAB — LIPID PANEL
Cholesterol: 205 mg/dL — ABNORMAL HIGH (ref 0–200)
HDL: 58.9 mg/dL (ref >39.00)
VLDL: 30 mg/dL (ref 0.0–40.0)

## 2011-09-15 ENCOUNTER — Encounter: Payer: BC Managed Care – PPO | Admitting: Family Medicine

## 2011-09-19 ENCOUNTER — Other Ambulatory Visit: Payer: Self-pay | Admitting: *Deleted

## 2011-09-19 MED ORDER — ESTRADIOL 0.5 MG PO TABS: 0.5000 mg | ORAL_TABLET | Freq: Every day | ORAL | Status: DC

## 2011-09-19 NOTE — Telephone Encounter (Signed)
Received faxed refill request from pharmacy. Refill sent to pharmacy electronically. 

## 2011-09-20 ENCOUNTER — Ambulatory Visit (INDEPENDENT_AMBULATORY_CARE_PROVIDER_SITE_OTHER): Payer: BC Managed Care – PPO | Admitting: Family Medicine

## 2011-09-20 ENCOUNTER — Encounter: Payer: Self-pay | Admitting: Family Medicine

## 2011-09-20 VITALS — BP 110/78 | HR 73 | Temp 97.8°F | Ht 64.75 in | Wt 188.1 lb

## 2011-09-20 DIAGNOSIS — R7309 Other abnormal glucose: Secondary | ICD-10-CM

## 2011-09-20 DIAGNOSIS — I10 Essential (primary) hypertension: Secondary | ICD-10-CM

## 2011-09-20 DIAGNOSIS — E785 Hyperlipidemia, unspecified: Secondary | ICD-10-CM

## 2011-09-20 DIAGNOSIS — Z0000 Encounter for general adult medical examination without abnormal findings: Secondary | ICD-10-CM

## 2011-09-20 NOTE — Progress Notes (Signed)
Subjective:    Patient ID: Bianca Hogan, female    DOB: 1956-02-23, 56 y.o.   MRN: [id number]  HPI  The patient is here for annual wellness exam and preventative care.  Moving to [place].. will transfer care.  Noted rash on left forearm... Applying antifungal (ketokonazole cream).. Has helped some. Started as red bump. Getting better  Hypertension: Well controlled on losartan, toprol Using medication without problems or lightheadedness: None Chest pain with exertion:None Edema:None Short of breath:None Average home BPs:not checking Other issues:  Elevated Cholesterol: Improved and at goal on 20 mg simvastatin Using medications without problems:None Muscle aches: None Diet compliance:Moderate Exercise: Aerobic classes 2 times a week Other complaints:         Review of Systems  Constitutional: Negative for fever and fatigue.  HENT: Positive for ear pain and congestion. Negative for hearing loss.        Cogestion , ears popping Right ear rupture in 06/2011  Eyes: Negative for pain.  Respiratory: Negative for cough and shortness of breath.   Cardiovascular: Positive for leg swelling.  Gastrointestinal: Negative for abdominal pain.  Genitourinary: Negative for dysuria.  Musculoskeletal: Negative for arthralgias.       Objective:   Physical Exam  Constitutional: Vital signs are normal. She appears well-developed and well-nourished. She is cooperative.  Non-toxic appearance. She does not appear ill. No distress.  HENT:  Head: Normocephalic.  Right Ear: Hearing, tympanic membrane, external ear and ear canal normal.  Left Ear: Hearing, tympanic membrane, external ear and ear canal normal.  Nose: Nose normal.  Eyes: Conjunctivae, EOM and lids are normal. Pupils are equal, round, and reactive to light. No foreign bodies found.  Neck: Trachea normal and normal range of motion. Neck supple. Carotid bruit is not present. No mass and no thyromegaly present.    Cardiovascular: Normal rate, regular rhythm, S1 normal, S2 normal, normal heart sounds and intact distal pulses.  Exam reveals no gallop.   No murmur heard. Pulmonary/Chest: Effort normal and breath sounds normal. No respiratory distress. She has no wheezes. She has no rhonchi. She has no rales.  Abdominal: Soft. Normal appearance and bowel sounds are normal. She exhibits no distension, no fluid wave, no abdominal bruit and no mass. There is no hepatosplenomegaly. There is no tenderness. There is no rebound, no guarding and no CVA tenderness. No hernia.  Genitourinary: No breast swelling, tenderness, discharge or bleeding. Pelvic exam was performed with patient prone.       No pap/DVE needed, no cervix, no ovaries  Lymphadenopathy:    She has no cervical adenopathy.    She has no axillary adenopathy.  Neurological: She is alert. She has normal strength. No cranial nerve deficit or sensory deficit.  Skin: Skin is warm, dry and intact. No rash noted.  Psychiatric: Her speech is normal and behavior is normal. Judgment normal. Her mood appears not anxious. Cognition and memory are normal. She does not exhibit a depressed mood.          Assessment & Plan:  The patient's preventative maintenance and recommended screening tests for an annual wellness exam were reviewed in full today. Brought up to date unless services declined.  Counselled on the importance of diet, exercise, and its role in overall health and mortality. The patient's FH and SH was reviewed, including their home life, tobacco status, and drug and alcohol status.   Vaccines:Up to date Colon:09/2010 nml, repeat 5 year Mammo: 12/2010 nml Pap: total vaginal hysterectomy, no cervix remains, no  pap/DVE needed DEXA: last done over 2 years done with [place] [facility]...she will let us know if she needs [place] to schedule.

## 2011-09-20 NOTE — Assessment & Plan Note (Signed)
Improved control of LDL.Marland Kitchen Now at goal <130 on simvastatin low dose.

## 2011-09-20 NOTE — Assessment & Plan Note (Signed)
Resolved with diet changes. 

## 2011-09-20 NOTE — Assessment & Plan Note (Signed)
Well controlled. Continue current medication. Encouraged exercise, weight loss, healthy eating habits.  

## 2011-09-20 NOTE — Patient Instructions (Addendum)
Due for mammogram in 12/2011 Due for bone density.. Let us know if we need to help schedule this for you. Conitnue work on regular exercise and healthy eating. Can try zyrtec or allegra for allergies. Follow up at [facility] office in 6 months. It has been great having you as a patient! We will miss you!

## 2011-09-27 ENCOUNTER — Telehealth: Payer: Self-pay | Admitting: Family Medicine

## 2011-09-27 NOTE — Telephone Encounter (Signed)
This is fine 

## 2011-09-27 NOTE — Telephone Encounter (Signed)
Patient wants to transfer to you Dr Beverely Low, and instead of doing NPE she wants to do a 67-month f/u visit from her last appointment. Please review and advise Thanks

## 2011-09-27 NOTE — Telephone Encounter (Signed)
Patient called and states they have moved to [facility] & would like to change practices, please review and advise Thanks

## 2011-09-27 NOTE — Telephone Encounter (Signed)
Already reviewed and okay to transfer if accepting MD okay with it.

## 2011-09-28 NOTE — Telephone Encounter (Signed)
Made a 42-month f/u from last cpe 5.7.13

## 2012-01-21 ENCOUNTER — Other Ambulatory Visit: Payer: Self-pay | Admitting: Family Medicine

## 2012-02-09 ENCOUNTER — Other Ambulatory Visit: Payer: Self-pay | Admitting: Family Medicine

## 2012-02-09 NOTE — Telephone Encounter (Signed)
Refill request for Metoprolol 50 mg 24 hr tablet #30 10 R sent electronic to [facility].

## 2012-02-13 ENCOUNTER — Other Ambulatory Visit: Payer: Self-pay | Admitting: Family Medicine

## 2012-02-13 DIAGNOSIS — Z1231 Encounter for screening mammogram for malignant neoplasm of breast: Secondary | ICD-10-CM

## 2012-03-07 ENCOUNTER — Other Ambulatory Visit: Payer: Self-pay | Admitting: Family Medicine

## 2012-03-07 ENCOUNTER — Ambulatory Visit
Admission: RE | Admit: 2012-03-07 | Discharge: 2012-03-07 | Disposition: A | Discharge status: Home / Self Care | Payer: BC Managed Care – PPO | Source: Ambulatory Visit | Attending: Family Medicine | Admitting: Family Medicine

## 2012-03-07 DIAGNOSIS — Z1231 Encounter for screening mammogram for malignant neoplasm of breast: Secondary | ICD-10-CM

## 2012-03-22 ENCOUNTER — Encounter: Payer: Self-pay | Admitting: Family Medicine

## 2012-03-22 ENCOUNTER — Ambulatory Visit (INDEPENDENT_AMBULATORY_CARE_PROVIDER_SITE_OTHER): Payer: BC Managed Care – PPO | Admitting: Family Medicine

## 2012-03-22 VITALS — BP 120/84 | HR 58 | Temp 98.6°F | Resp 16 | Ht 65.75 in | Wt 195.2 lb

## 2012-03-22 DIAGNOSIS — Z23 Encounter for immunization: Secondary | ICD-10-CM

## 2012-03-22 DIAGNOSIS — E785 Hyperlipidemia, unspecified: Secondary | ICD-10-CM

## 2012-03-22 DIAGNOSIS — I10 Essential (primary) hypertension: Secondary | ICD-10-CM

## 2012-03-22 NOTE — Patient Instructions (Addendum)
Schedule your complete physical in 6 months We'll notify you of your lab results and make any changes if needed Keep up the good work- you look great! Call with any questions or concerns Welcome!  We're glad to have you!!

## 2012-03-22 NOTE — Progress Notes (Signed)
  Subjective:    Patient ID: Bianca Hogan, female    DOB: August 05, 1955, 56 y.o.   MRN: [id number]  HPI HTN- chronic problem, on Cozaar, Toprol XL.  No CP, SOB, HAs, visual changes, edema, palpitations.  Hyperlipidemia- chronic problem, on simvastatin.  No abd pain, N/V, myalgias.   Review of Systems For ROS see HPI     Objective:   Physical Exam  Vitals reviewed. Constitutional: She is oriented to person, place, and time. She appears well-developed and well-nourished. No distress.  HENT:  Head: Normocephalic and atraumatic.  Eyes: Conjunctivae normal and EOM are normal. Pupils are equal, round, and reactive to light.  Neck: Normal range of motion. Neck supple. No thyromegaly present.  Cardiovascular: Normal rate, regular rhythm, normal heart sounds and intact distal pulses.   No murmur heard. Pulmonary/Chest: Effort normal and breath sounds normal. No respiratory distress.  Abdominal: Soft. She exhibits no distension. There is no tenderness.  Musculoskeletal: She exhibits no edema.  Lymphadenopathy:    She has no cervical adenopathy.  Neurological: She is alert and oriented to person, place, and time.  Skin: Skin is warm and dry.  Psychiatric: She has a normal mood and affect. Her behavior is normal.          Assessment & Plan:

## 2012-03-23 LAB — LIPID PANEL
Total CHOL/HDL Ratio: 4
Triglycerides: 152 mg/dL — ABNORMAL HIGH (ref 0.0–149.0)

## 2012-03-23 LAB — HEPATIC FUNCTION PANEL
ALT: 21 U/L (ref 0–35)
Albumin: 3.5 g/dL (ref 3.5–5.2)
Bilirubin, Direct: 0.1 mg/dL (ref 0.0–0.3)
Total Protein: 6.2 g/dL (ref 6.0–8.3)

## 2012-03-23 LAB — BASIC METABOLIC PANEL
CO2: 26 mEq/L (ref 19–32)
Chloride: 106 mEq/L (ref 96–112)
Potassium: 3.7 mEq/L (ref 3.5–5.1)

## 2012-03-23 NOTE — Assessment & Plan Note (Signed)
Chronic problem, tolerating statin w/out difficulty.  Check labs.  Adjust meds prn  

## 2012-03-23 NOTE — Assessment & Plan Note (Signed)
Chronic problem.  BP well controlled.  Asymptomatic.  Check labs.  No anticipated changes. 

## 2012-04-02 ENCOUNTER — Other Ambulatory Visit: Payer: Self-pay | Admitting: Family Medicine

## 2012-04-02 MED ORDER — ESTRADIOL 0.5 MG PO TABS: 0.5000 mg | ORAL_TABLET | Freq: Every day | ORAL | Status: DC

## 2012-04-02 NOTE — Telephone Encounter (Signed)
refill Estradiol oral tablet 0.5mg  take one tablet by mouth one time daily last fill 8.18.13 last ov 11.7.13 follow up

## 2012-04-02 NOTE — Telephone Encounter (Signed)
Rx sent 

## 2012-05-05 ENCOUNTER — Other Ambulatory Visit: Payer: Self-pay | Admitting: Family Medicine

## 2012-05-21 ENCOUNTER — Other Ambulatory Visit: Payer: Self-pay | Admitting: Family Medicine

## 2012-05-21 MED ORDER — SIMVASTATIN 20 MG PO TABS: 20.0000 mg | ORAL_TABLET | Freq: Every day | ORAL | Status: DC

## 2012-05-21 NOTE — Telephone Encounter (Signed)
REFILL Simvastatin (Tab) 20 MG TAKE 1 TABLET EACH NIGHT AT BEDTIME #30 LAST FILL 12.7.13

## 2012-05-21 NOTE — Telephone Encounter (Signed)
Rx sent to the pharmacy by e-script.//AB/CMA 

## 2012-06-04 ENCOUNTER — Telehealth: Payer: Self-pay | Admitting: Family Medicine

## 2012-06-04 DIAGNOSIS — I10 Essential (primary) hypertension: Secondary | ICD-10-CM

## 2012-06-04 MED ORDER — LOSARTAN POTASSIUM 50 MG PO TABS: 50.0000 mg | ORAL_TABLET | Freq: Every day | ORAL | Status: DC

## 2012-06-04 NOTE — Telephone Encounter (Signed)
refill Losartan Potassium (Tab) 50 MG TAKE ONE TABLET BY MOUTH ONE TIME DAILY  #30 last fill 12.23.13

## 2012-06-04 NOTE — Telephone Encounter (Signed)
Refill for losartan sent to [facility], [address]

## 2012-07-09 ENCOUNTER — Other Ambulatory Visit: Payer: Self-pay | Admitting: Family Medicine

## 2012-07-09 DIAGNOSIS — I10 Essential (primary) hypertension: Secondary | ICD-10-CM

## 2012-07-10 NOTE — Telephone Encounter (Signed)
Refill for Cozaar sent to [facility] in [place]

## 2012-07-15 ENCOUNTER — Other Ambulatory Visit: Payer: Self-pay | Admitting: Family Medicine

## 2012-09-04 ENCOUNTER — Other Ambulatory Visit: Payer: Self-pay | Admitting: Family Medicine

## 2012-09-19 ENCOUNTER — Encounter: Payer: Self-pay | Admitting: Family Medicine

## 2012-09-19 ENCOUNTER — Ambulatory Visit (INDEPENDENT_AMBULATORY_CARE_PROVIDER_SITE_OTHER): Payer: BC Managed Care – PPO | Admitting: Family Medicine

## 2012-09-19 VITALS — BP 118/70 | HR 53 | Temp 97.5°F | Ht 64.25 in | Wt 176.0 lb

## 2012-09-19 DIAGNOSIS — Z0000 Encounter for general adult medical examination without abnormal findings: Secondary | ICD-10-CM

## 2012-09-19 NOTE — Progress Notes (Signed)
  Subjective:    Patient ID: Bianca Hogan, female    DOB: [birth year], 57 y.o.   MRN: [id number]  HPI CPE- UTD on mammo and colonoscopy, no need for pap due to hysterectomy.  No concerns today.   Review of Systems Patient reports no vision/ hearing changes, adenopathy,fever, weight change,  persistant/recurrent hoarseness , swallowing issues, chest pain, palpitations, edema, persistant/recurrent cough, hemoptysis, dyspnea (rest/exertional/paroxysmal nocturnal), gastrointestinal bleeding (melena, rectal bleeding), abdominal pain, significant heartburn, bowel changes, GU symptoms (dysuria, hematuria, incontinence), Gyn symptoms (abnormal  bleeding, pain),  syncope, focal weakness, memory loss, numbness & tingling, skin/hair/nail changes, abnormal bruising or bleeding, anxiety, or depression.     Objective:   Physical Exam  General Appearance:    Alert, cooperative, no distress, appears stated age  Head:    Normocephalic, without obvious abnormality, atraumatic  Eyes:    PERRL, conjunctiva/corneas clear, EOM's intact, fundi    benign, both eyes  Ears:    Normal TM's and external ear canals, both ears  Nose:   Nares normal, septum midline, mucosa normal, no drainage    or sinus tenderness  Throat:   Lips, mucosa, and tongue normal; teeth and gums normal  Neck:   Supple, symmetrical, trachea midline, no adenopathy;    Thyroid: no enlargement/tenderness/nodules  Back:     Symmetric, no curvature, ROM normal, no CVA tenderness  Lungs:     Clear to auscultation bilaterally, respirations unlabored  Chest Wall:    No tenderness or deformity   Heart:    Regular rate and rhythm, S1 and S2 normal, no murmur, rub   or gallop  Breast Exam:    No tenderness, masses, or nipple abnormality  Abdomen:     Soft, non-tender, bowel sounds active all four quadrants,    no masses, no organomegaly  Genitalia:    Deferred due to TAH  Rectal:    Extremities:   Extremities normal, atraumatic, no cyanosis or  edema  Pulses:   2+ and symmetric all extremities  Skin:   Skin color, texture, turgor normal, no rashes or lesions  Lymph nodes:   Cervical, supraclavicular, and axillary nodes normal  Neurologic:   CNII-XII intact, normal strength, sensation and reflexes    throughout          Assessment & Plan:

## 2012-09-19 NOTE — Assessment & Plan Note (Signed)
Pt's PE WNL.  UTD on colonoscopy, mammo.  Check labs.  Anticipatory guidance provided.  

## 2012-09-19 NOTE — Patient Instructions (Addendum)
Follow up in 6 months to recheck your labs Keep up the good work- you look great! We'll notify you of your lab results and make any changes if needed Call with any questions or concerns Happy Mother's Day!!!

## 2012-09-20 ENCOUNTER — Encounter: Payer: Self-pay | Admitting: *Deleted

## 2012-09-20 LAB — CBC WITH DIFFERENTIAL/PLATELET
Basophils Absolute: 0.1 10*3/uL (ref 0.0–0.1)
Basophils Relative: 2.2 % (ref 0.0–3.0)
Eosinophils Absolute: 0.2 10*3/uL (ref 0.0–0.7)
Eosinophils Relative: 4.3 % (ref 0.0–5.0)
HCT: 38 % (ref 36.0–46.0)
Hemoglobin: 13.1 g/dL (ref 12.0–15.0)
Lymphocytes Relative: 37.7 % (ref 12.0–46.0)
Lymphs Abs: 1.8 10*3/uL (ref 0.7–4.0)
MCHC: 34.4 g/dL (ref 30.0–36.0)
MCV: 90 fl (ref 78.0–100.0)
Monocytes Absolute: 0.5 10*3/uL (ref 0.1–1.0)
Monocytes Relative: 10.5 % (ref 3.0–12.0)
Neutro Abs: 2.2 10*3/uL (ref 1.4–7.7)
Neutrophils Relative %: 45.3 % (ref 43.0–77.0)
Platelets: 209 10*3/uL (ref 150.0–400.0)
RBC: 4.23 Mil/uL (ref 3.87–5.11)
RDW: 13.3 % (ref 11.5–14.6)
WBC: 4.9 10*3/uL (ref 4.5–10.5)

## 2012-09-20 LAB — HEPATIC FUNCTION PANEL
ALT: 21 U/L (ref 0–35)
AST: 22 U/L (ref 0–37)
Alkaline Phosphatase: 64 U/L (ref 39–117)
Bilirubin, Direct: 0 mg/dL (ref 0.0–0.3)
Total Bilirubin: 0.6 mg/dL (ref 0.3–1.2)
Total Protein: 6 g/dL (ref 6.0–8.3)

## 2012-09-20 LAB — LIPID PANEL
LDL Cholesterol: 81 mg/dL (ref 0–99)
Total CHOL/HDL Ratio: 3
VLDL: 18.6 mg/dL (ref 0.0–40.0)

## 2012-09-20 LAB — BASIC METABOLIC PANEL
BUN: 14 mg/dL (ref 6–23)
CO2: 27 mEq/L (ref 19–32)
Calcium: 8.6 mg/dL (ref 8.4–10.5)
Chloride: 107 mEq/L (ref 96–112)
Creatinine, Ser: 0.8 mg/dL (ref 0.4–1.2)
GFR: 83.31 mL/min (ref >60.00)
Glucose, Bld: 83 mg/dL (ref 70–99)
Potassium: 3.5 mEq/L (ref 3.5–5.1)
Sodium: 139 mEq/L (ref 135–145)

## 2012-09-23 ENCOUNTER — Other Ambulatory Visit: Payer: Self-pay | Admitting: Family Medicine

## 2012-09-23 LAB — VITAMIN D 1,25 DIHYDROXY
Vitamin D2 1, 25 (OH)2: <8 pg/mL
Vitamin D3 1, 25 (OH)2: 55 pg/mL

## 2012-09-25 ENCOUNTER — Encounter: Payer: Self-pay | Admitting: General Practice

## 2012-12-15 ENCOUNTER — Other Ambulatory Visit: Payer: Self-pay | Admitting: Family Medicine

## 2013-01-31 ENCOUNTER — Other Ambulatory Visit: Payer: Self-pay | Admitting: Family Medicine

## 2013-02-01 NOTE — Telephone Encounter (Signed)
Rx filled and sent to [facility].  SW, CMA 

## 2013-02-06 ENCOUNTER — Other Ambulatory Visit: Payer: Self-pay | Admitting: General Practice

## 2013-02-06 MED ORDER — METOPROLOL SUCCINATE ER 50 MG PO TB24: ORAL_TABLET | ORAL | Status: DC

## 2013-02-17 ENCOUNTER — Other Ambulatory Visit: Payer: Self-pay | Admitting: Family Medicine

## 2013-02-18 NOTE — Telephone Encounter (Signed)
Med filled.  

## 2013-02-24 ENCOUNTER — Other Ambulatory Visit: Payer: Self-pay | Admitting: Family Medicine

## 2013-02-25 ENCOUNTER — Other Ambulatory Visit: Payer: Self-pay

## 2013-02-25 DIAGNOSIS — Z1231 Encounter for screening mammogram for malignant neoplasm of breast: Secondary | ICD-10-CM

## 2013-02-25 NOTE — Telephone Encounter (Signed)
Med filled, pt needs a follow up.

## 2013-03-18 ENCOUNTER — Ambulatory Visit: Payer: BC Managed Care – PPO | Admitting: Family Medicine

## 2013-03-21 ENCOUNTER — Ambulatory Visit
Admission: RE | Admit: 2013-03-21 | Discharge: 2013-03-21 | Disposition: A | Discharge status: Home / Self Care | Payer: BC Managed Care – PPO | Source: Ambulatory Visit

## 2013-03-21 DIAGNOSIS — Z1231 Encounter for screening mammogram for malignant neoplasm of breast: Secondary | ICD-10-CM

## 2013-03-25 ENCOUNTER — Encounter: Payer: Self-pay | Admitting: Family Medicine

## 2013-03-25 ENCOUNTER — Ambulatory Visit (INDEPENDENT_AMBULATORY_CARE_PROVIDER_SITE_OTHER): Payer: BC Managed Care – PPO | Admitting: Family Medicine

## 2013-03-25 VITALS — BP 130/78 | HR 63 | Temp 97.8°F | Resp 16 | Wt 182.5 lb

## 2013-03-25 DIAGNOSIS — Z23 Encounter for immunization: Secondary | ICD-10-CM

## 2013-03-25 DIAGNOSIS — E785 Hyperlipidemia, unspecified: Secondary | ICD-10-CM

## 2013-03-25 DIAGNOSIS — I10 Essential (primary) hypertension: Secondary | ICD-10-CM

## 2013-03-25 MED ORDER — METOPROLOL SUCCINATE ER 25 MG PO TB24: 25.0000 mg | ORAL_TABLET | Freq: Every day | ORAL | Status: DC

## 2013-03-25 NOTE — Assessment & Plan Note (Signed)
Chronic problem.  Adequate control.  Pt interested in weaning Metoprolol.  Will decrease to 25mg  daily and monitor for recurrence in palpitations or elevated BP.  Reviewed supportive care and red flags that should prompt return.  Pt expressed understanding and is in agreement w/ plan.

## 2013-03-25 NOTE — Patient Instructions (Signed)
Follow up in 6-8 weeks to recheck BP as we wean your Metoprolol We'll notify you of your lab results and make any changes if needed Keep up the good work!  You look great! Call with any questions or concerns Happy Holidays!!!

## 2013-03-25 NOTE — Progress Notes (Signed)
  Subjective:    Patient ID: Bianca Hogan, female    DOB: 26-Mar-1956, 57 y.o.   MRN: [id number]  HPI Pre visit review using our clinic review tool, if applicable. No additional management support is needed unless otherwise documented below in the visit note.  HTN- chronic problem, on Losartan and Metoprolol.  Has lost 30 lbs since pt was initially started on metoprolol for palpitations.  Denies current palpitations, HR well controlled in the low 60s.  No CP, SOB, HAs, visual changes, edema.  + fatigue.  Hyperlipidemia- chronic problem.  On simvastatin.  Denies abd pain, N/V, myalgias.   Review of Systems For ROS see HPI     Objective:   Physical Exam  Vitals reviewed. Constitutional: She is oriented to person, place, and time. She appears well-developed and well-nourished. No distress.  HENT:  Head: Normocephalic and atraumatic.  Eyes: Conjunctivae and EOM are normal. Pupils are equal, round, and reactive to light.  Neck: Normal range of motion. Neck supple. No thyromegaly present.  Cardiovascular: Normal rate, regular rhythm, normal heart sounds and intact distal pulses.   No murmur heard. Pulmonary/Chest: Effort normal and breath sounds normal. No respiratory distress.  Abdominal: Soft. She exhibits no distension. There is no tenderness.  Musculoskeletal: She exhibits no edema.  Lymphadenopathy:    She has no cervical adenopathy.  Neurological: She is alert and oriented to person, place, and time.  Skin: Skin is warm and dry.  Psychiatric: She has a normal mood and affect. Her behavior is normal.          Assessment & Plan:

## 2013-03-25 NOTE — Assessment & Plan Note (Signed)
Chronic problem.  Tolerating statin w/out difficulty.  Check labs.  Adjust meds prn  

## 2013-03-26 ENCOUNTER — Encounter: Payer: Self-pay | Admitting: General Practice

## 2013-03-26 LAB — LIPID PANEL
Cholesterol: 194 mg/dL (ref 0–200)
HDL: 63 mg/dL (ref >39.00)
Total CHOL/HDL Ratio: 3
Triglycerides: 95 mg/dL (ref 0.0–149.0)
VLDL: 19 mg/dL (ref 0.0–40.0)

## 2013-03-26 LAB — BASIC METABOLIC PANEL
BUN: 15 mg/dL (ref 6–23)
Calcium: 9.1 mg/dL (ref 8.4–10.5)
Creatinine, Ser: 0.8 mg/dL (ref 0.4–1.2)
GFR: 79.52 mL/min (ref >60.00)
Glucose, Bld: 93 mg/dL (ref 70–99)
Potassium: 4 mEq/L (ref 3.5–5.1)

## 2013-03-26 LAB — HEPATIC FUNCTION PANEL
ALT: 21 U/L (ref 0–35)
AST: 24 U/L (ref 0–37)
Albumin: 3.7 g/dL (ref 3.5–5.2)
Bilirubin, Direct: 0.1 mg/dL (ref 0.0–0.3)

## 2013-04-08 ENCOUNTER — Other Ambulatory Visit: Payer: Self-pay | Admitting: Family Medicine

## 2013-04-09 NOTE — Telephone Encounter (Signed)
Med filled.  

## 2013-04-27 ENCOUNTER — Other Ambulatory Visit: Payer: Self-pay | Admitting: Family Medicine

## 2013-04-29 NOTE — Telephone Encounter (Signed)
Med filled.  

## 2013-05-23 ENCOUNTER — Encounter: Payer: Self-pay | Admitting: Family Medicine

## 2013-05-23 ENCOUNTER — Ambulatory Visit (INDEPENDENT_AMBULATORY_CARE_PROVIDER_SITE_OTHER): Payer: BC Managed Care – PPO | Admitting: Family Medicine

## 2013-05-23 VITALS — BP 122/74 | HR 64 | Temp 98.0°F | Resp 16 | Wt 187.1 lb

## 2013-05-23 DIAGNOSIS — I10 Essential (primary) hypertension: Secondary | ICD-10-CM

## 2013-05-23 NOTE — Patient Instructions (Signed)
Follow up in 1-2 months to recheck BP STOP the metoprolol Continue the Losartan daily Call if you again have palpitations or the BP is consistently higher than 140/90 Call with any questions or concerns Happy New Year!!!

## 2013-05-23 NOTE — Assessment & Plan Note (Signed)
sxs are well controlled.  Asymptomatic.  Stop metoprolol.  Monitor BP and watch for palpitations.  Pt expressed understanding and is in agreement w/ plan.

## 2013-05-23 NOTE — Progress Notes (Signed)
Pre visit review using our clinic review tool, if applicable. No additional management support is needed unless otherwise documented below in the visit note. 

## 2013-05-23 NOTE — Progress Notes (Signed)
   Subjective:    Patient ID: Bianca Hogan, female    DOB: August 27, 1955, 58 y.o.   MRN: [id number]  HPI HTN- chronic problem, decreased Metoprolol last visit to 25mg  daily.  BP remains well controlled.  No CP, SOB, HAs, visual changes, palpitations, edema.  Remains on Losartan.  Interested in stopping Metoprolol if possible.   Review of Systems For ROS see HPI     Objective:   Physical Exam  Vitals reviewed. Constitutional: She is oriented to person, place, and time. She appears well-developed and well-nourished. No distress.  HENT:  Head: Normocephalic and atraumatic.  Eyes: Conjunctivae and EOM are normal. Pupils are equal, round, and reactive to light.  Neck: Normal range of motion. Neck supple. No thyromegaly present.  Cardiovascular: Normal rate, regular rhythm, normal heart sounds and intact distal pulses.   No murmur heard. Pulmonary/Chest: Effort normal and breath sounds normal. No respiratory distress.  Abdominal: Soft. She exhibits no distension. There is no tenderness.  Musculoskeletal: She exhibits no edema.  Lymphadenopathy:    She has no cervical adenopathy.  Neurological: She is alert and oriented to person, place, and time.  Skin: Skin is warm and dry.  Psychiatric: She has a normal mood and affect. Her behavior is normal.          Assessment & Plan:

## 2013-06-18 ENCOUNTER — Telehealth: Payer: Self-pay | Admitting: Family Medicine

## 2013-06-18 NOTE — Telephone Encounter (Signed)
Relevant patient education mailed to patient.  

## 2013-07-11 ENCOUNTER — Ambulatory Visit: Payer: BC Managed Care – PPO | Admitting: Family Medicine

## 2013-07-24 ENCOUNTER — Encounter (INDEPENDENT_AMBULATORY_CARE_PROVIDER_SITE_OTHER): Payer: Self-pay

## 2013-07-24 ENCOUNTER — Ambulatory Visit (INDEPENDENT_AMBULATORY_CARE_PROVIDER_SITE_OTHER): Payer: BC Managed Care – PPO | Admitting: Family Medicine

## 2013-07-24 ENCOUNTER — Encounter: Payer: Self-pay | Admitting: Family Medicine

## 2013-07-24 VITALS — BP 108/72 | HR 62 | Temp 98.4°F | Resp 16 | Wt 179.0 lb

## 2013-07-24 DIAGNOSIS — Z79890 Hormone replacement therapy (postmenopausal): Secondary | ICD-10-CM | POA: Insufficient documentation

## 2013-07-24 DIAGNOSIS — I10 Essential (primary) hypertension: Secondary | ICD-10-CM

## 2013-07-24 NOTE — Assessment & Plan Note (Signed)
Pt has been on Estrogen daily.  Recommended she decrease to every other day and then 3x/week and then stop as she feels able.  Will follow at CPE.

## 2013-07-24 NOTE — Progress Notes (Signed)
   Subjective:    Patient ID: Bianca Hogan, female    DOB: 1955/09/13, 58 y.o.   MRN: [id number]  HPI HTN- stopped metoprolol at last visit.  Remains on the Losartan daily.  BP is excellent today.  No CP, SOB, HAs, visual changes, edema.  HRT- pt interested in weaning off estrogen and asking how best to do this.   Review of Systems For ROS see HPI     Objective:   Physical Exam  Vitals reviewed. Constitutional: She is oriented to person, place, and time. She appears well-developed and well-nourished. No distress.  HENT:  Head: Normocephalic and atraumatic.  Eyes: Conjunctivae and EOM are normal. Pupils are equal, round, and reactive to light.  Neck: Normal range of motion. Neck supple. No thyromegaly present.  Cardiovascular: Normal rate, regular rhythm, normal heart sounds and intact distal pulses.   No murmur heard. Pulmonary/Chest: Effort normal and breath sounds normal. No respiratory distress.  Abdominal: Soft. She exhibits no distension. There is no tenderness.  Musculoskeletal: She exhibits no edema.  Lymphadenopathy:    She has no cervical adenopathy.  Neurological: She is alert and oriented to person, place, and time.  Skin: Skin is warm and dry.  Psychiatric: She has a normal mood and affect. Her behavior is normal.          Assessment & Plan:

## 2013-07-24 NOTE — Patient Instructions (Addendum)
Schedule your complete physical for after May 7th Wean the estrogen- start taking med every other day for 2-4 weeks and then decrease to 3x/week, and then stop Start calcium + Vit D daily (2 Caltrate is the recommended dose) Keep up the good work!  You look great! Happy Spring!

## 2013-07-24 NOTE — Progress Notes (Signed)
Pre visit review using our clinic review tool, if applicable. No additional management support is needed unless otherwise documented below in the visit note. 

## 2013-07-24 NOTE — Assessment & Plan Note (Signed)
Chronic problem.  Remains well controlled even after stopping metoprolol.  Asymptomatic.

## 2013-09-07 ENCOUNTER — Other Ambulatory Visit: Payer: Self-pay | Admitting: Family Medicine

## 2013-09-09 NOTE — Telephone Encounter (Signed)
Med filled.  

## 2013-09-11 ENCOUNTER — Other Ambulatory Visit (INDEPENDENT_AMBULATORY_CARE_PROVIDER_SITE_OTHER): Payer: BC Managed Care – PPO

## 2013-09-11 DIAGNOSIS — Z0000 Encounter for general adult medical examination without abnormal findings: Secondary | ICD-10-CM

## 2013-09-11 DIAGNOSIS — E785 Hyperlipidemia, unspecified: Secondary | ICD-10-CM

## 2013-09-11 DIAGNOSIS — I10 Essential (primary) hypertension: Secondary | ICD-10-CM

## 2013-09-11 LAB — BASIC METABOLIC PANEL
BUN: 13 mg/dL (ref 6–23)
CALCIUM: 9.2 mg/dL (ref 8.4–10.5)
CHLORIDE: 105 meq/L (ref 96–112)
CO2: 26 meq/L (ref 19–32)
Creatinine, Ser: 0.7 mg/dL (ref 0.4–1.2)
GFR: 89.81 mL/min (ref >60.00)
GLUCOSE: 87 mg/dL (ref 70–99)
POTASSIUM: 3.9 meq/L (ref 3.5–5.1)
Sodium: 138 mEq/L (ref 135–145)

## 2013-09-11 LAB — LIPID PANEL
CHOL/HDL RATIO: 3
Cholesterol: 155 mg/dL (ref 0–200)
HDL: 56.6 mg/dL (ref >39.00)
LDL CALC: 85 mg/dL (ref 0–99)
TRIGLYCERIDES: 67 mg/dL (ref 0.0–149.0)
VLDL: 13.4 mg/dL (ref 0.0–40.0)

## 2013-09-11 LAB — VITAMIN B12: VITAMIN B 12: 852 pg/mL (ref 211–911)

## 2013-09-11 LAB — CBC WITH DIFFERENTIAL/PLATELET
BASOS ABS: 0 10*3/uL (ref 0.0–0.1)
Basophils Relative: 0.3 % (ref 0.0–3.0)
EOS PCT: 2.2 % (ref 0.0–5.0)
Eosinophils Absolute: 0.1 10*3/uL (ref 0.0–0.7)
HEMATOCRIT: 40.1 % (ref 36.0–46.0)
Hemoglobin: 13.5 g/dL (ref 12.0–15.0)
LYMPHS ABS: 1.5 10*3/uL (ref 0.7–4.0)
Lymphocytes Relative: 31.7 % (ref 12.0–46.0)
MCHC: 33.6 g/dL (ref 30.0–36.0)
MCV: 92.9 fl (ref 78.0–100.0)
Monocytes Absolute: 0.4 10*3/uL (ref 0.1–1.0)
Monocytes Relative: 7.7 % (ref 3.0–12.0)
NEUTROS PCT: 58.1 % (ref 43.0–77.0)
Neutro Abs: 2.8 10*3/uL (ref 1.4–7.7)
Platelets: 223 10*3/uL (ref 150.0–400.0)
RBC: 4.32 Mil/uL (ref 3.87–5.11)
RDW: 13.2 % (ref 11.5–14.6)
WBC: 4.8 10*3/uL (ref 4.5–10.5)

## 2013-09-11 LAB — HEPATIC FUNCTION PANEL
ALBUMIN: 3.8 g/dL (ref 3.5–5.2)
ALT: 18 U/L (ref 0–35)
AST: 18 U/L (ref 0–37)
Alkaline Phosphatase: 57 U/L (ref 39–117)
Bilirubin, Direct: 0 mg/dL (ref 0.0–0.3)
TOTAL PROTEIN: 6.4 g/dL (ref 6.0–8.3)
Total Bilirubin: 0.4 mg/dL (ref 0.3–1.2)

## 2013-09-11 LAB — TSH: TSH: 0.63 u[IU]/mL (ref 0.35–5.50)

## 2013-09-11 NOTE — Addendum Note (Signed)
Addended by: Harl Bowie on: 09/11/2013 08:19 AM   Modules accepted: Orders

## 2013-09-13 ENCOUNTER — Other Ambulatory Visit: Payer: BC Managed Care – PPO

## 2013-09-20 ENCOUNTER — Encounter: Payer: Self-pay | Admitting: Family Medicine

## 2013-09-20 ENCOUNTER — Ambulatory Visit (INDEPENDENT_AMBULATORY_CARE_PROVIDER_SITE_OTHER): Payer: BC Managed Care – PPO | Admitting: Family Medicine

## 2013-09-20 ENCOUNTER — Telehealth: Payer: Self-pay | Admitting: Internal Medicine

## 2013-09-20 VITALS — BP 106/76 | HR 69 | Temp 97.6°F | Resp 16 | Ht 64.25 in | Wt 175.2 lb

## 2013-09-20 DIAGNOSIS — Z0000 Encounter for general adult medical examination without abnormal findings: Secondary | ICD-10-CM

## 2013-09-20 NOTE — Telephone Encounter (Signed)
A user error has taken place.

## 2013-09-20 NOTE — Progress Notes (Signed)
   Subjective:    Patient ID: Bianca Hogan, female    DOB: [birth year], 58 y.o.   MRN: [id number]  HPI CPE- UTD on mammo, colonoscopy, no need for paps due to TAH.   Review of Systems Patient reports no vision/ hearing changes, adenopathy,fever, weight change,  persistant/recurrent hoarseness , swallowing issues, chest pain, palpitations, edema, persistant/recurrent cough, hemoptysis, dyspnea (rest/exertional/paroxysmal nocturnal), gastrointestinal bleeding (melena, rectal bleeding), abdominal pain, significant heartburn, bowel changes, GU symptoms (dysuria, hematuria, incontinence), Gyn symptoms (abnormal  bleeding, pain),  syncope, focal weakness, memory loss, numbness & tingling, skin/hair/nail changes, abnormal bruising or bleeding, anxiety, or depression.     Objective:   Physical Exam  General Appearance:    Alert, cooperative, no distress, appears stated age  Head:    Normocephalic, without obvious abnormality, atraumatic  Eyes:    PERRL, conjunctiva/corneas clear, EOM's intact, fundi    benign, both eyes  Ears:    Normal TM's and external ear canals, both ears  Nose:   Nares normal, septum midline, mucosa normal, no drainage    or sinus tenderness  Throat:   Lips, mucosa, and tongue normal; teeth and gums normal  Neck:   Supple, symmetrical, trachea midline, no adenopathy;    Thyroid: no enlargement/tenderness/nodules  Back:     Symmetric, no curvature, ROM normal, no CVA tenderness  Lungs:     Clear to auscultation bilaterally, respirations unlabored  Chest Wall:    No tenderness or deformity   Heart:    Regular rate and rhythm, S1 and S2 normal, no murmur, rub   or gallop  Breast Exam:    No tenderness, masses, or nipple abnormality  Abdomen:     Soft, non-tender, bowel sounds active all four quadrants,    no masses, no organomegaly  Genitalia:    deferred  Rectal:    Extremities:   Extremities normal, atraumatic, no cyanosis or edema  Pulses:   2+ and symmetric all  extremities  Skin:   Skin color, texture, turgor normal, no rashes or lesions  Lymph nodes:   Cervical, supraclavicular, and axillary nodes normal  Neurologic:   CNII-XII intact, normal strength, sensation and reflexes    throughout          Assessment & Plan:

## 2013-09-20 NOTE — Patient Instructions (Signed)
Follow up in 3 months to recheck BP Decrease Losartan to 1/2 tab daily We'll notify you of your lab results and make any changes if needed Call with any questions or concerns Keep up the good work!  You look great!!! Happy Mother's Day!!

## 2013-09-20 NOTE — Progress Notes (Signed)
Pre visit review using our clinic review tool, if applicable. No additional management support is needed unless otherwise documented below in the visit note. 

## 2013-09-22 NOTE — Assessment & Plan Note (Signed)
Pt's PE WNL.  UTD on mammo and colonoscopy.  No need for paps.  Reviewed recent labs.  No changes.

## 2013-11-10 ENCOUNTER — Other Ambulatory Visit: Payer: Self-pay | Admitting: Family Medicine

## 2013-11-11 NOTE — Telephone Encounter (Signed)
Med filled.  

## 2013-12-08 ENCOUNTER — Other Ambulatory Visit: Payer: Self-pay | Admitting: Family Medicine

## 2013-12-09 NOTE — Telephone Encounter (Signed)
Med filled.  

## 2013-12-23 ENCOUNTER — Encounter: Payer: Self-pay | Admitting: Family Medicine

## 2013-12-23 ENCOUNTER — Ambulatory Visit (INDEPENDENT_AMBULATORY_CARE_PROVIDER_SITE_OTHER): Payer: BC Managed Care – PPO | Admitting: Family Medicine

## 2013-12-23 VITALS — BP 116/82 | HR 62 | Temp 97.5°F | Wt 177.8 lb

## 2013-12-23 DIAGNOSIS — R0602 Shortness of breath: Secondary | ICD-10-CM | POA: Insufficient documentation

## 2013-12-23 DIAGNOSIS — I10 Essential (primary) hypertension: Secondary | ICD-10-CM

## 2013-12-23 DIAGNOSIS — E785 Hyperlipidemia, unspecified: Secondary | ICD-10-CM

## 2013-12-23 NOTE — Progress Notes (Signed)
   Subjective:    Patient ID: Bianca Hogan, female    DOB: 1956-02-11, 58 y.o.   MRN: [id number]  HPI HTN- chronic problem, on Losartan 50mg  daily.  Pt initially tried to decrease to 1/2 tab but BP increased w/ family stressors (daughter in drug/ETOH facility).  Denies CP, HAs, visual changes, edema.  Some intermittent SOB, ~5x/week.  'it just feels as if i'm not getting a good breath'.  Pt unable to give a timeline to SOB or make a link to possible cause.  Hyperlipidemia- chronic problem, on Simvastatin.  No abd pain, N/V, myalgias.   Review of Systems For ROS see HPI     Objective:   Physical Exam  Vitals reviewed. Constitutional: She is oriented to person, place, and time. She appears well-developed and well-nourished. No distress.  HENT:  Head: Normocephalic and atraumatic.  Eyes: Conjunctivae and EOM are normal. Pupils are equal, round, and reactive to light.  Neck: Normal range of motion. Neck supple. No thyromegaly present.  Cardiovascular: Normal rate, regular rhythm, normal heart sounds and intact distal pulses.   No murmur heard. Pulmonary/Chest: Effort normal and breath sounds normal. No respiratory distress.  Abdominal: Soft. She exhibits no distension. There is no tenderness.  Musculoskeletal: She exhibits no edema.  Lymphadenopathy:    She has no cervical adenopathy.  Neurological: She is alert and oriented to person, place, and time.  Skin: Skin is warm and dry.  Psychiatric: She has a normal mood and affect. Her behavior is normal.          Assessment & Plan:

## 2013-12-23 NOTE — Progress Notes (Signed)
Pre visit review using our clinic review tool, if applicable. No additional management support is needed unless otherwise documented below in the visit note. 

## 2013-12-23 NOTE — Assessment & Plan Note (Signed)
Chronic problem.  Was unable to wean Losartan due to highly stressful summer.  BP remains well controlled on 1 tab daily.  Check labs.  No anticipated med changes.

## 2013-12-23 NOTE — Assessment & Plan Note (Signed)
New.  Pt reports this has been occuring for 'months' but she never mentioned it in hopes 'it would just go away'.  Pt unable to link sxs to any particular cause or time of day.  EKG unchanged from previous.  Lungs CTAB.  Check labs to r/o anemia, thyroid abnormality, electrolyte disturbance.  If no improvement, will refer to pulmonary.  Encouraged pt to log sxs.  Will follow.

## 2013-12-23 NOTE — Patient Instructions (Signed)
Schedule your complete physical in May We'll notify you of your lab results and make any changes if needed Continue the Losartan daily If your shortness of breath continues or worsens- please call so we can refer you to Pulmonary for complete evaluation Call with any questions or concerns Hang in there!!!

## 2013-12-23 NOTE — Assessment & Plan Note (Signed)
Chronic problem.  Tolerating statin w/o difficulty.  Check labs.  Adjust meds prn  

## 2013-12-24 ENCOUNTER — Telehealth: Payer: Self-pay | Admitting: Family Medicine

## 2013-12-24 LAB — HEPATIC FUNCTION PANEL
ALT: 24 U/L (ref 0–35)
AST: 23 U/L (ref 0–37)
Albumin: 3.5 g/dL (ref 3.5–5.2)
Alkaline Phosphatase: 65 U/L (ref 39–117)
BILIRUBIN DIRECT: 0 mg/dL (ref 0.0–0.3)
TOTAL PROTEIN: 6.4 g/dL (ref 6.0–8.3)
Total Bilirubin: 0.4 mg/dL (ref 0.2–1.2)

## 2013-12-24 LAB — CBC WITH DIFFERENTIAL/PLATELET
Basophils Absolute: 0 10*3/uL (ref 0.0–0.1)
Basophils Relative: 0.7 % (ref 0.0–3.0)
EOS ABS: 0.1 10*3/uL (ref 0.0–0.7)
Eosinophils Relative: 1.9 % (ref 0.0–5.0)
HEMATOCRIT: 40.8 % (ref 36.0–46.0)
Hemoglobin: 13.7 g/dL (ref 12.0–15.0)
LYMPHS ABS: 1.5 10*3/uL (ref 0.7–4.0)
Lymphocytes Relative: 28.1 % (ref 12.0–46.0)
MCHC: 33.6 g/dL (ref 30.0–36.0)
MCV: 91.1 fl (ref 78.0–100.0)
MONO ABS: 0.5 10*3/uL (ref 0.1–1.0)
Monocytes Relative: 9.1 % (ref 3.0–12.0)
Neutro Abs: 3.3 10*3/uL (ref 1.4–7.7)
Neutrophils Relative %: 60.2 % (ref 43.0–77.0)
PLATELETS: 227 10*3/uL (ref 150.0–400.0)
RBC: 4.48 Mil/uL (ref 3.87–5.11)
RDW: 13.9 % (ref 11.5–15.5)
WBC: 5.4 10*3/uL (ref 4.0–10.5)

## 2013-12-24 LAB — LIPID PANEL
CHOLESTEROL: 216 mg/dL — AB (ref 0–200)
HDL: 68.1 mg/dL (ref >39.00)
LDL Cholesterol: 127 mg/dL — ABNORMAL HIGH (ref 0–99)
NonHDL: 147.9
TRIGLYCERIDES: 107 mg/dL (ref 0.0–149.0)
Total CHOL/HDL Ratio: 3
VLDL: 21.4 mg/dL (ref 0.0–40.0)

## 2013-12-24 LAB — TSH: TSH: 0.61 u[IU]/mL (ref 0.35–4.50)

## 2013-12-24 LAB — BASIC METABOLIC PANEL
BUN: 14 mg/dL (ref 6–23)
CHLORIDE: 105 meq/L (ref 96–112)
CO2: 24 mEq/L (ref 19–32)
CREATININE: 0.7 mg/dL (ref 0.4–1.2)
Calcium: 9 mg/dL (ref 8.4–10.5)
GFR: 92.72 mL/min (ref >60.00)
Glucose, Bld: 66 mg/dL — ABNORMAL LOW (ref 70–99)
POTASSIUM: 3.6 meq/L (ref 3.5–5.1)
Sodium: 138 mEq/L (ref 135–145)

## 2013-12-24 NOTE — Telephone Encounter (Signed)
Relevant patient education assigned to patient using Emmi. ° °

## 2013-12-29 ENCOUNTER — Other Ambulatory Visit: Payer: Self-pay | Admitting: Family Medicine

## 2013-12-30 NOTE — Telephone Encounter (Signed)
Med filled.  

## 2014-02-13 ENCOUNTER — Other Ambulatory Visit: Payer: Self-pay

## 2014-02-13 DIAGNOSIS — Z1231 Encounter for screening mammogram for malignant neoplasm of breast: Secondary | ICD-10-CM

## 2014-03-07 ENCOUNTER — Other Ambulatory Visit: Payer: Self-pay | Admitting: Family Medicine

## 2014-03-08 NOTE — Telephone Encounter (Signed)
Med filled.  

## 2014-03-24 ENCOUNTER — Ambulatory Visit (INDEPENDENT_AMBULATORY_CARE_PROVIDER_SITE_OTHER): Payer: BC Managed Care – PPO | Admitting: Family Medicine

## 2014-03-24 ENCOUNTER — Encounter: Payer: Self-pay | Admitting: Family Medicine

## 2014-03-24 ENCOUNTER — Ambulatory Visit (INDEPENDENT_AMBULATORY_CARE_PROVIDER_SITE_OTHER): Payer: BC Managed Care – PPO | Admitting: General Practice

## 2014-03-24 VITALS — BP 110/74 | HR 74 | Temp 98.1°F | Resp 16 | Wt 179.5 lb

## 2014-03-24 DIAGNOSIS — I10 Essential (primary) hypertension: Secondary | ICD-10-CM

## 2014-03-24 DIAGNOSIS — Z23 Encounter for immunization: Secondary | ICD-10-CM

## 2014-03-24 DIAGNOSIS — R0602 Shortness of breath: Secondary | ICD-10-CM

## 2014-03-24 MED ORDER — VALACYCLOVIR HCL 1 G PO TABS: ORAL_TABLET | ORAL | Status: DC

## 2014-03-24 NOTE — Progress Notes (Signed)
   Subjective:    Patient ID: Bianca Hogan, female    DOB: 01-22-1956, 58 y.o.   MRN: [id number]  HPI HTN- chronic problem, on 1 tab of Losartan daily.  Denies CP, SOB is much improved, no HAs, visual changes, edema.  SOB- pt reports sxs improved and has been much better since last visit.  Now attributes SOB to stress/anxiety.  No CP.  No wheezing.  No cough.   Review of Systems For ROS see HPI     Objective:   Physical Exam  Constitutional: She is oriented to person, place, and time. She appears well-developed and well-nourished. No distress.  HENT:  Head: Normocephalic and atraumatic.  Eyes: Conjunctivae and EOM are normal. Pupils are equal, round, and reactive to light.  Neck: Normal range of motion. Neck supple. No thyromegaly present.  Cardiovascular: Normal rate, regular rhythm, normal heart sounds and intact distal pulses.   No murmur heard. Pulmonary/Chest: Effort normal and breath sounds normal. No respiratory distress.  Abdominal: Soft. She exhibits no distension. There is no tenderness.  Musculoskeletal: She exhibits no edema.  Lymphadenopathy:    She has no cervical adenopathy.  Neurological: She is alert and oriented to person, place, and time.  Skin: Skin is warm and dry.  Psychiatric: She has a normal mood and affect. Her behavior is normal.  Vitals reviewed.         Assessment & Plan:

## 2014-03-24 NOTE — Patient Instructions (Signed)
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

## 2014-03-24 NOTE — Progress Notes (Signed)
Pre visit review using our clinic review tool, if applicable. No additional management support is needed unless otherwise documented below in the visit note. 

## 2014-03-25 ENCOUNTER — Ambulatory Visit
Admission: RE | Admit: 2014-03-25 | Discharge: 2014-03-25 | Disposition: A | Discharge status: Home / Self Care | Payer: BC Managed Care – PPO | Source: Ambulatory Visit

## 2014-03-25 DIAGNOSIS — Z1231 Encounter for screening mammogram for malignant neoplasm of breast: Secondary | ICD-10-CM

## 2014-03-25 NOTE — Assessment & Plan Note (Signed)
This has resolved.  Pt feels this is anxiety related and is not concerned about this at present.  Will continue to follow.

## 2014-03-25 NOTE — Assessment & Plan Note (Signed)
Chronic problem.  Remains well controlled on Losartan but was unable to wean due to elevated BPs.  Currently asymptomatic.  Will continue to follow.

## 2014-04-08 ENCOUNTER — Telehealth: Payer: Self-pay | Admitting: Family Medicine

## 2014-04-08 NOTE — Telephone Encounter (Signed)
Caller name: Melanee Relation to pt: self Call back number: [phone] Pharmacy:   Reason for call:   Patient is requesting mammogram results

## 2014-04-08 NOTE — Telephone Encounter (Signed)
Called and left a message on pt voice mail notifying that we do not have the results and typically she will have to contact the facility that competed the scan.

## 2014-05-06 ENCOUNTER — Other Ambulatory Visit: Payer: Self-pay | Admitting: Family Medicine

## 2014-05-06 NOTE — Telephone Encounter (Signed)
Med filled.  

## 2014-05-31 ENCOUNTER — Other Ambulatory Visit: Payer: Self-pay | Admitting: Family Medicine

## 2014-06-02 NOTE — Telephone Encounter (Signed)
Med filled.  

## 2014-06-05 ENCOUNTER — Encounter: Payer: Self-pay | Admitting: Family Medicine

## 2014-06-05 ENCOUNTER — Ambulatory Visit (INDEPENDENT_AMBULATORY_CARE_PROVIDER_SITE_OTHER): Payer: BC Managed Care – PPO | Admitting: Family Medicine

## 2014-06-05 VITALS — BP 122/80 | HR 71 | Temp 98.1°F | Resp 16 | Wt 183.5 lb

## 2014-06-05 DIAGNOSIS — R140 Abdominal distension (gaseous): Secondary | ICD-10-CM

## 2014-06-05 LAB — CBC WITH DIFFERENTIAL/PLATELET
Basophils Absolute: 0.1 10*3/uL (ref 0.0–0.1)
Basophils Relative: 1 % (ref 0–1)
EOS ABS: 0.2 10*3/uL (ref 0.0–0.7)
Eosinophils Relative: 3 % (ref 0–5)
HCT: 40.9 % (ref 36.0–46.0)
Hemoglobin: 13.7 g/dL (ref 12.0–15.0)
LYMPHS ABS: 1.9 10*3/uL (ref 0.7–4.0)
Lymphocytes Relative: 33 % (ref 12–46)
MCH: 31.2 pg (ref 26.0–34.0)
MCHC: 33.5 g/dL (ref 30.0–36.0)
MCV: 93.2 fL (ref 78.0–100.0)
MONOS PCT: 11 % (ref 3–12)
MPV: 10.1 fL (ref 8.6–12.4)
Monocytes Absolute: 0.6 10*3/uL (ref 0.1–1.0)
NEUTROS PCT: 52 % (ref 43–77)
Neutro Abs: 3 10*3/uL (ref 1.7–7.7)
Platelets: 237 10*3/uL (ref 150–400)
RBC: 4.39 MIL/uL (ref 3.87–5.11)
RDW: 12.8 % (ref 11.5–15.5)
WBC: 5.7 10*3/uL (ref 4.0–10.5)

## 2014-06-05 LAB — BASIC METABOLIC PANEL
BUN: 17 mg/dL (ref 6–23)
CO2: 25 meq/L (ref 19–32)
Calcium: 8.7 mg/dL (ref 8.4–10.5)
Chloride: 107 mEq/L (ref 96–112)
Creat: 0.8 mg/dL (ref 0.50–1.10)
Glucose, Bld: 87 mg/dL (ref 70–99)
Potassium: 3.9 mEq/L (ref 3.5–5.3)
Sodium: 139 mEq/L (ref 135–145)

## 2014-06-05 LAB — HEPATIC FUNCTION PANEL
ALBUMIN: 3.7 g/dL (ref 3.5–5.2)
ALK PHOS: 70 U/L (ref 39–117)
ALT: 19 U/L (ref 0–35)
AST: 19 U/L (ref 0–37)
BILIRUBIN INDIRECT: 0.2 mg/dL (ref 0.2–1.2)
BILIRUBIN TOTAL: 0.3 mg/dL (ref 0.2–1.2)
Bilirubin, Direct: 0.1 mg/dL (ref 0.0–0.3)
Total Protein: 6 g/dL (ref 6.0–8.3)

## 2014-06-05 MED ORDER — OMEPRAZOLE 20 MG PO CPDR: 20.0000 mg | DELAYED_RELEASE_CAPSULE | Freq: Every day | ORAL | Status: DC

## 2014-06-05 NOTE — Patient Instructions (Signed)
Follow up in 1 month to reassess gas/bloating Start the Omeprazole daily to decrease acid production Start a daily probiotic (Align or store brand equivalent) Increase your water intake Continue to make healthy food choices We'll notify you of your lab results and make any changes if needed Call with any questions or concerns Hang in there!!

## 2014-06-05 NOTE — Progress Notes (Signed)
   Subjective:    Patient ID: Bianca Hogan, female    DOB: 17-Feb-1956, 58 y.o.   MRN: [id number]  HPI Gas/bloating- pt reports discomfort, 'dull ache', increased 'gurggling' of stomach.  Increased urgency to bowels.  sxs started in the last few months.  Has typically had 2-3 BMs daily- frequency has not changed but urgency has.  + loose stools but this is not new for pt.  Increased gas and belching.  Pt has recently attempted to eat better but no change in fiber intake.  High stress levels due to daughter's addiction.   Review of Systems For ROS see HPI     Objective:   Physical Exam  Constitutional: She is oriented to person, place, and time. She appears well-developed and well-nourished. No distress.  HENT:  Head: Normocephalic and atraumatic.  MMM  Neck: Neck supple.  Cardiovascular: Normal rate, regular rhythm and intact distal pulses.   Pulmonary/Chest: Effort normal and breath sounds normal. No respiratory distress. She has no wheezes. She has no rales.  Abdominal: Soft. She exhibits no distension. There is no tenderness. There is no rebound.  Hyperactive BS  Lymphadenopathy:    She has no cervical adenopathy.  Neurological: She is alert and oriented to person, place, and time.  Skin: Skin is warm and dry.  Vitals reviewed.         Assessment & Plan:

## 2014-06-05 NOTE — Progress Notes (Signed)
Pre visit review using our clinic review tool, if applicable. No additional management support is needed unless otherwise documented below in the visit note. 

## 2014-06-07 NOTE — Assessment & Plan Note (Addendum)
New.  Suspect this is stress related and contributing to either IBS or excessive acid production/GERD.  Start PPI.  Check labs to r/o underlying biliary abnormality, infxn.  Start probiotic.  Encouraged stress outlet, dietary changes.  Will follow.

## 2014-06-13 LAB — H. PYLORI ANTIBODY, IGG: H Pylori IgG: 0.64 {ISR}

## 2014-06-22 ENCOUNTER — Other Ambulatory Visit: Payer: Self-pay | Admitting: Family Medicine

## 2014-06-23 NOTE — Telephone Encounter (Signed)
Med filled.  

## 2014-07-10 ENCOUNTER — Ambulatory Visit: Payer: BC Managed Care – PPO | Admitting: Family Medicine

## 2014-07-21 ENCOUNTER — Ambulatory Visit (INDEPENDENT_AMBULATORY_CARE_PROVIDER_SITE_OTHER): Payer: BC Managed Care – PPO | Admitting: Family Medicine

## 2014-07-21 ENCOUNTER — Encounter: Payer: Self-pay | Admitting: Family Medicine

## 2014-07-21 VITALS — BP 120/80 | HR 78 | Temp 97.9°F | Resp 16 | Wt 183.4 lb

## 2014-07-21 DIAGNOSIS — R140 Abdominal distension (gaseous): Secondary | ICD-10-CM

## 2014-07-21 NOTE — Progress Notes (Signed)
   Subjective:    Patient ID: Bianca Hogan, female    DOB: 02/04/1956, 58 y.o.   MRN: [id number]  HPI Gas/bloating- pt was seen 1 month ago w/ gas/bloating and started on Omeprazole and the probiotic.  Pt reports overall sxs are much improved.  Still some intermittent breakthrough sxs but these are infrequent and more mild than previous.  No N/V, less full and distended.   Review of Systems For ROS see HPI     Objective:   Physical Exam  Constitutional: She is oriented to person, place, and time. She appears well-developed and well-nourished. No distress.  Cardiovascular: Normal rate, regular rhythm, normal heart sounds and intact distal pulses.   Pulmonary/Chest: Effort normal and breath sounds normal. No respiratory distress. She has no wheezes. She has no rales.  Abdominal: Soft. Bowel sounds are normal. She exhibits no distension. There is no tenderness. There is no rebound and no guarding.  Neurological: She is alert and oriented to person, place, and time.  Skin: Skin is warm and dry.  Psychiatric: She has a normal mood and affect. Her behavior is normal. Thought content normal.  Vitals reviewed.         Assessment & Plan:

## 2014-07-21 NOTE — Assessment & Plan Note (Signed)
Much improved since starting PPI and probiotics.  Pt is currently asymptomatic and feeling much better.  No changes at this time.  Will follow.

## 2014-07-21 NOTE — Patient Instructions (Signed)
Follow up as scheduled Continue the omeprazole daily Keep up the good work!  You look great! Call with any questions or concerns Happy Spring!!!

## 2014-07-21 NOTE — Progress Notes (Signed)
Pre visit review using our clinic review tool, if applicable. No additional management support is needed unless otherwise documented below in the visit note. 

## 2014-07-30 ENCOUNTER — Other Ambulatory Visit: Payer: Self-pay | Admitting: Family Medicine

## 2014-07-30 NOTE — Telephone Encounter (Signed)
Med filled.  

## 2014-08-21 ENCOUNTER — Telehealth: Payer: Self-pay | Admitting: Family Medicine

## 2014-08-21 MED ORDER — CLOTRIMAZOLE-BETAMETHASONE 1-0.05 % EX CREA: 1.0000 "application " | TOPICAL_CREAM | Freq: Two times a day (BID) | CUTANEOUS | Status: DC

## 2014-08-21 NOTE — Telephone Encounter (Signed)
Pt states it is more raw from her bra underwire. Pt would like a different topical medication since ketoconazole is not working.

## 2014-08-21 NOTE — Telephone Encounter (Signed)
To have an oral medication she would need an appt. Could you call and schedule?

## 2014-08-21 NOTE — Telephone Encounter (Signed)
Med filled and pt notified.  

## 2014-08-21 NOTE — Telephone Encounter (Signed)
Ok to switch to Lotrisone twice daily and if possible, avoid underwire bras (sports bra would be best if able)

## 2014-08-21 NOTE — Telephone Encounter (Signed)
If pt has open sores/cuts, she really needs an appt for evaluation.  Also, we have no record of what that medication was, so if she wants [place] to consider that specific medication, she would need to contact her dermatologist and let us know what it [place]

## 2014-08-21 NOTE — Telephone Encounter (Signed)
Pt states that this oral medication was given a couple of years ago. Pt has tried ketoconazole rx and an anti-fungal powder and they have  not worked. Pt states that it is under the breast where the wire to her bra touches. Pt advised that it looks like little cuts due to the irritation. Please advise.

## 2014-08-21 NOTE — Telephone Encounter (Signed)
She wants an rx for a yeast infection under her breast  She has a topical cream  But the dermatologist had given her an oral med for this.  Please send this to [facility]

## 2014-08-21 NOTE — Telephone Encounter (Signed)
Patient states that she does not want topical ointment. She is requesting something oral

## 2014-08-22 NOTE — Telephone Encounter (Signed)
Left detailed message informing patient of this and to call and schedule appointment

## 2014-09-02 ENCOUNTER — Telehealth: Payer: Self-pay | Admitting: Family Medicine

## 2014-09-02 NOTE — Telephone Encounter (Signed)
Pre Visit letter sent  °

## 2014-09-03 ENCOUNTER — Telehealth: Payer: Self-pay

## 2014-09-07 ENCOUNTER — Other Ambulatory Visit: Payer: Self-pay | Admitting: Family Medicine

## 2014-09-08 NOTE — Telephone Encounter (Signed)
Med filled.  

## 2014-09-10 ENCOUNTER — Telehealth: Payer: Self-pay | Admitting: *Deleted

## 2014-09-10 ENCOUNTER — Other Ambulatory Visit: Payer: Self-pay | Admitting: General Practice

## 2014-09-10 MED ORDER — OMEPRAZOLE 20 MG PO CPDR: 20.0000 mg | DELAYED_RELEASE_CAPSULE | Freq: Every day | ORAL | Status: DC

## 2014-09-10 NOTE — Telephone Encounter (Signed)
Prior authorization for omeprazole initiated through [place]. Awaiting determination. JG//CMA

## 2014-09-15 ENCOUNTER — Encounter: Payer: Self-pay | Admitting: Family Medicine

## 2014-09-16 MED ORDER — KETOCONAZOLE 2 % EX CREA: 1.0000 "application " | TOPICAL_CREAM | Freq: Every day | CUTANEOUS | Status: DC | PRN

## 2014-09-16 NOTE — Telephone Encounter (Signed)
Med filled.  

## 2014-09-19 ENCOUNTER — Telehealth: Payer: Self-pay | Admitting: *Deleted

## 2014-09-19 ENCOUNTER — Encounter: Payer: Self-pay | Admitting: *Deleted

## 2014-09-19 NOTE — Telephone Encounter (Signed)
Pre-Visit Call completed with patient and chart updated.   Pre-Visit Info documented in Specialty Comments under [employer].    

## 2014-09-22 ENCOUNTER — Ambulatory Visit (INDEPENDENT_AMBULATORY_CARE_PROVIDER_SITE_OTHER): Payer: BC Managed Care – PPO | Admitting: Family Medicine

## 2014-09-22 ENCOUNTER — Encounter: Payer: Self-pay | Admitting: Family Medicine

## 2014-09-22 VITALS — BP 122/70 | HR 68 | Temp 98.2°F | Resp 16 | Ht 64.75 in | Wt 186.4 lb

## 2014-09-22 DIAGNOSIS — Z0000 Encounter for general adult medical examination without abnormal findings: Secondary | ICD-10-CM

## 2014-09-22 DIAGNOSIS — Z780 Asymptomatic menopausal state: Secondary | ICD-10-CM

## 2014-09-22 LAB — CBC WITH DIFFERENTIAL/PLATELET
Basophils Absolute: 0 10*3/uL (ref 0.0–0.1)
Basophils Relative: 0.3 % (ref 0.0–3.0)
EOS PCT: 2.4 % (ref 0.0–5.0)
Eosinophils Absolute: 0.1 10*3/uL (ref 0.0–0.7)
HCT: 41.5 % (ref 36.0–46.0)
HEMOGLOBIN: 14.4 g/dL (ref 12.0–15.0)
LYMPHS ABS: 1.4 10*3/uL (ref 0.7–4.0)
LYMPHS PCT: 31.1 % (ref 12.0–46.0)
MCHC: 34.6 g/dL (ref 30.0–36.0)
MCV: 88.7 fl (ref 78.0–100.0)
MONOS PCT: 8.1 % (ref 3.0–12.0)
Monocytes Absolute: 0.4 10*3/uL (ref 0.1–1.0)
NEUTROS ABS: 2.7 10*3/uL (ref 1.4–7.7)
Neutrophils Relative %: 58.1 % (ref 43.0–77.0)
Platelets: 253 10*3/uL (ref 150.0–400.0)
RBC: 4.68 Mil/uL (ref 3.87–5.11)
RDW: 13.4 % (ref 11.5–15.5)
WBC: 4.6 10*3/uL (ref 4.0–10.5)

## 2014-09-22 LAB — VITAMIN D 25 HYDROXY (VIT D DEFICIENCY, FRACTURES): VITD: 25.33 ng/mL — ABNORMAL LOW (ref 30.00–100.00)

## 2014-09-22 LAB — HEPATIC FUNCTION PANEL
ALBUMIN: 3.5 g/dL (ref 3.5–5.2)
ALK PHOS: 77 U/L (ref 39–117)
ALT: 21 U/L (ref 0–35)
AST: 18 U/L (ref 0–37)
Bilirubin, Direct: 0 mg/dL (ref 0.0–0.3)
TOTAL PROTEIN: 6.4 g/dL (ref 6.0–8.3)
Total Bilirubin: 0.3 mg/dL (ref 0.2–1.2)

## 2014-09-22 LAB — LIPID PANEL
CHOLESTEROL: 223 mg/dL — AB (ref 0–200)
HDL: 67.7 mg/dL (ref >39.00)
LDL Cholesterol: 132 mg/dL — ABNORMAL HIGH (ref 0–99)
NonHDL: 155.3
Total CHOL/HDL Ratio: 3
Triglycerides: 117 mg/dL (ref 0.0–149.0)
VLDL: 23.4 mg/dL (ref 0.0–40.0)

## 2014-09-22 LAB — TSH: TSH: 1.1 u[IU]/mL (ref 0.35–4.50)

## 2014-09-22 LAB — BASIC METABOLIC PANEL
BUN: 16 mg/dL (ref 6–23)
CHLORIDE: 105 meq/L (ref 96–112)
CO2: 26 mEq/L (ref 19–32)
CREATININE: 0.64 mg/dL (ref 0.40–1.20)
Calcium: 8.9 mg/dL (ref 8.4–10.5)
GFR: 100.87 mL/min (ref >60.00)
Glucose, Bld: 98 mg/dL (ref 70–99)
Potassium: 3.7 mEq/L (ref 3.5–5.1)
Sodium: 137 mEq/L (ref 135–145)

## 2014-09-22 MED ORDER — SIMVASTATIN 20 MG PO TABS: 20.0000 mg | ORAL_TABLET | Freq: Every day | ORAL | Status: DC

## 2014-09-22 MED ORDER — KETOCONAZOLE 2 % EX CREA: 1.0000 "application " | TOPICAL_CREAM | Freq: Every day | CUTANEOUS | Status: DC | PRN

## 2014-09-22 MED ORDER — ESTRADIOL 0.5 MG PO TABS: 0.5000 mg | ORAL_TABLET | Freq: Every day | ORAL | Status: DC

## 2014-09-22 MED ORDER — LOSARTAN POTASSIUM 50 MG PO TABS: 50.0000 mg | ORAL_TABLET | Freq: Every day | ORAL | Status: DC

## 2014-09-22 NOTE — Assessment & Plan Note (Signed)
Pt's PE WNL.  UTD on colonoscopy, mammo- due for DEXA.  Order entered and pt encouraged to schedule at time of mammo.  Check labs.  Anticipatory guidance provided.

## 2014-09-22 NOTE — Progress Notes (Signed)
Pre visit review using our clinic review tool, if applicable. No additional management support is needed unless otherwise documented below in the visit note. 

## 2014-09-22 NOTE — Progress Notes (Signed)
   Subjective:    Patient ID: Bianca Hogan, female    DOB: [birth year], 59 y.o.   MRN: [id number]  HPI CPE- UTD on mammo, colonoscopy.  No need for pap due to hysterectomy.  Due for DEXA [facility]).   Review of Systems Patient reports no vision/ hearing changes, adenopathy,fever, weight change,  persistant/recurrent hoarseness , swallowing issues, chest pain, palpitations, edema, persistant/recurrent cough, hemoptysis, dyspnea (rest/exertional/paroxysmal nocturnal), gastrointestinal bleeding (melena, rectal bleeding), abdominal pain, significant heartburn, bowel changes, GU symptoms (dysuria, hematuria, incontinence), Gyn symptoms (abnormal  bleeding, pain),  syncope, focal weakness, memory loss, numbness & tingling, skin/hair/nail changes, abnormal bruising or bleeding, anxiety, or depression.     Objective:   Physical Exam General Appearance:    Alert, cooperative, no distress, appears stated age  Head:    Normocephalic, without obvious abnormality, atraumatic  Eyes:    PERRL, conjunctiva/corneas clear, EOM's intact, fundi    benign, both eyes  Ears:    Normal TM's and external ear canals, both ears  Nose:   Nares normal, septum midline, mucosa normal, no drainage    or sinus tenderness  Throat:   Lips, mucosa, and tongue normal; teeth and gums normal  Neck:   Supple, symmetrical, trachea midline, no adenopathy;    Thyroid: no enlargement/tenderness/nodules  Back:     Symmetric, no curvature, ROM normal, no CVA tenderness  Lungs:     Clear to auscultation bilaterally, respirations unlabored  Chest Wall:    No tenderness or deformity   Heart:    Regular rate and rhythm, S1 and S2 normal, no murmur, rub   or gallop  Breast Exam:    Deferred to mammo  Abdomen:     Soft, non-tender, bowel sounds active all four quadrants,    no masses, no organomegaly  Genitalia:    Deferred  Rectal:    Extremities:   Extremities normal, atraumatic, no cyanosis or edema  Pulses:   2+ and  symmetric all extremities  Skin:   Skin color, texture, turgor normal, no rashes or lesions  Lymph nodes:   Cervical, supraclavicular, and axillary nodes normal  Neurologic:   CNII-XII intact, normal strength, sensation and reflexes    throughout          Assessment & Plan:

## 2014-09-22 NOTE — Patient Instructions (Signed)
Follow up in 6 months to recheck BP and cholesterol We'll notify you of your lab results and make any changes if needed When time for the mammo, call and schedule both mammo and bone density Keep up the good work!  You look great! Call with any questions or concerns Happy [place] Mother's Day!!!

## 2014-09-29 NOTE — Telephone Encounter (Signed)
pati

## 2014-10-02 ENCOUNTER — Telehealth: Payer: Self-pay | Admitting: Family Medicine

## 2014-10-02 NOTE — Telephone Encounter (Signed)
Relation to OJ:JKKX  Call back number: [phone]   Reason for call:  As per pt insurance [place] is needed  for omeprazole [facility] CASE # [id number]

## 2014-10-06 NOTE — Telephone Encounter (Signed)
Can switch to protonix 40mg  daily

## 2014-10-06 NOTE — Telephone Encounter (Signed)
PA initiated. Awaiting determination. JG//CMA 

## 2014-10-06 NOTE — Telephone Encounter (Signed)
Relation to pt: self  Call back number: [phone] Pharmacy: [id number] - [place], [place] [phone] (Phone) [phone] (Fax)         Reason for call:  Pt is requesting an alternate medication until determination, pt states she is completely out.

## 2014-10-07 MED ORDER — PANTOPRAZOLE SODIUM 40 MG PO TBEC: 40.0000 mg | DELAYED_RELEASE_TABLET | Freq: Every day | ORAL | Status: DC

## 2014-10-07 NOTE — Telephone Encounter (Signed)
Med filled and pt notified.  

## 2014-10-16 ENCOUNTER — Other Ambulatory Visit: Payer: Self-pay | Admitting: Family Medicine

## 2014-10-16 NOTE — Telephone Encounter (Signed)
Med filled.  

## 2014-10-16 NOTE — Telephone Encounter (Addendum)
Caller name: Beverlee Nims from [place]  Call back number: [phone]   Reason for call:  Pharmacy would like to know the status of RX

## 2015-02-20 ENCOUNTER — Other Ambulatory Visit: Payer: Self-pay

## 2015-02-20 DIAGNOSIS — Z1231 Encounter for screening mammogram for malignant neoplasm of breast: Secondary | ICD-10-CM

## 2015-03-24 ENCOUNTER — Ambulatory Visit: Payer: BC Managed Care – PPO | Admitting: Family Medicine

## 2015-03-25 ENCOUNTER — Encounter: Payer: Self-pay | Admitting: Family Medicine

## 2015-03-25 ENCOUNTER — Ambulatory Visit (INDEPENDENT_AMBULATORY_CARE_PROVIDER_SITE_OTHER): Payer: BC Managed Care – PPO | Admitting: Family Medicine

## 2015-03-25 VITALS — BP 120/70 | HR 60 | Temp 98.3°F | Resp 14 | Ht 65.0 in | Wt 195.2 lb

## 2015-03-25 DIAGNOSIS — N3946 Mixed incontinence: Secondary | ICD-10-CM

## 2015-03-25 DIAGNOSIS — B369 Superficial mycosis, unspecified: Secondary | ICD-10-CM | POA: Insufficient documentation

## 2015-03-25 DIAGNOSIS — M25552 Pain in left hip: Secondary | ICD-10-CM

## 2015-03-25 DIAGNOSIS — I10 Essential (primary) hypertension: Secondary | ICD-10-CM | POA: Diagnosis not present

## 2015-03-25 DIAGNOSIS — E785 Hyperlipidemia, unspecified: Secondary | ICD-10-CM | POA: Diagnosis not present

## 2015-03-25 DIAGNOSIS — Z23 Encounter for immunization: Secondary | ICD-10-CM

## 2015-03-25 LAB — CBC WITH DIFFERENTIAL/PLATELET
Basophils Absolute: 0 10*3/uL (ref 0.0–0.1)
Basophils Relative: 0.3 % (ref 0.0–3.0)
EOS PCT: 2.9 % (ref 0.0–5.0)
Eosinophils Absolute: 0.1 10*3/uL (ref 0.0–0.7)
HCT: 42.9 % (ref 36.0–46.0)
HEMOGLOBIN: 14.3 g/dL (ref 12.0–15.0)
LYMPHS PCT: 28.1 % (ref 12.0–46.0)
Lymphs Abs: 1.4 10*3/uL (ref 0.7–4.0)
MCHC: 33.2 g/dL (ref 30.0–36.0)
MCV: 93.2 fl (ref 78.0–100.0)
MONO ABS: 0.4 10*3/uL (ref 0.1–1.0)
MONOS PCT: 8.6 % (ref 3.0–12.0)
Neutro Abs: 3.1 10*3/uL (ref 1.4–7.7)
Neutrophils Relative %: 60.1 % (ref 43.0–77.0)
Platelets: 257 10*3/uL (ref 150.0–400.0)
RBC: 4.61 Mil/uL (ref 3.87–5.11)
RDW: 12.6 % (ref 11.5–15.5)
WBC: 5.1 10*3/uL (ref 4.0–10.5)

## 2015-03-25 LAB — BASIC METABOLIC PANEL
BUN: 10 mg/dL (ref 6–23)
CALCIUM: 8.7 mg/dL (ref 8.4–10.5)
CO2: 26 mEq/L (ref 19–32)
CREATININE: 0.6 mg/dL (ref 0.40–1.20)
Chloride: 105 mEq/L (ref 96–112)
GFR: 108.49 mL/min (ref >60.00)
Glucose, Bld: 97 mg/dL (ref 70–99)
Potassium: 3.7 mEq/L (ref 3.5–5.1)
Sodium: 138 mEq/L (ref 135–145)

## 2015-03-25 LAB — HEPATIC FUNCTION PANEL
ALBUMIN: 3.6 g/dL (ref 3.5–5.2)
ALT: 24 U/L (ref 0–35)
AST: 25 U/L (ref 0–37)
Alkaline Phosphatase: 69 U/L (ref 39–117)
Bilirubin, Direct: 0 mg/dL (ref 0.0–0.3)
Total Bilirubin: 0.3 mg/dL (ref 0.2–1.2)
Total Protein: 5.9 g/dL — ABNORMAL LOW (ref 6.0–8.3)

## 2015-03-25 LAB — LIPID PANEL
CHOL/HDL RATIO: 3
Cholesterol: 216 mg/dL — ABNORMAL HIGH (ref 0–200)
HDL: 67.2 mg/dL (ref >39.00)
LDL CALC: 136 mg/dL — AB (ref 0–99)
NonHDL: 148.57
TRIGLYCERIDES: 65 mg/dL (ref 0.0–149.0)
VLDL: 13 mg/dL (ref 0.0–40.0)

## 2015-03-25 LAB — TSH: TSH: 1.54 u[IU]/mL (ref 0.35–4.50)

## 2015-03-25 MED ORDER — CLOTRIMAZOLE-BETAMETHASONE 1-0.05 % EX CREA: 1.0000 "application " | TOPICAL_CREAM | Freq: Two times a day (BID) | CUTANEOUS | Status: DC

## 2015-03-25 MED ORDER — SOLIFENACIN SUCCINATE 5 MG PO TABS: 5.0000 mg | ORAL_TABLET | Freq: Every day | ORAL | Status: DC

## 2015-03-25 NOTE — Progress Notes (Signed)
Pre visit review using our clinic review tool, if applicable. No additional management support is needed unless otherwise documented below in the visit note. 

## 2015-03-25 NOTE — Assessment & Plan Note (Signed)
New.  Muscular- no bony tenderness.  Recommended OTC tylenol/ibuprofen, heat.  Reviewed supportive care and red flags that should prompt return.  Pt expressed understanding and is in agreement w/ plan.

## 2015-03-25 NOTE — Assessment & Plan Note (Signed)
Chronic problem.  Tolerating statin w/o difficulty.  Stressed need for healthy diet and regular exercise.  Check labs.  Adjust meds prn  

## 2015-03-25 NOTE — Assessment & Plan Note (Signed)
Chronic problem.  Adequate control.  Asymptomatic.  Check labs.  No anticipated med changes 

## 2015-03-25 NOTE — Assessment & Plan Note (Signed)
New to provider, ongoing for pt.  Will start Vesicare and monitor for improvement.  If no improvement, will refer to urology.  Pt expressed understanding and is in agreement w/ plan.

## 2015-03-25 NOTE — Progress Notes (Signed)
   Subjective:    Patient ID: Bianca Hogan, female    DOB: 06-Jun-1955, 59 y.o.   MRN: [id number]  HPI Hyperlipidemia- chronic problem, on Simvastatin.  Pt has gained 9 lbs since last visit.  No N/V, intermittent abd discomfort due to IBS.  No myalgias.  HTN- chronic problem, on Losartan.  BP well controlled today.  No CP, SOB, HAs, visual changes, edema.  Fungal dermatitis- pt has recurrent yeast under L breast, occasionally R.  Pt has been battling this for 7 yrs.  Has seen derm previously for this.  Has tried 'all different kinds of medications'.  Has actually been on oral tx previously- fluconazole.  Currently using ketoconazole daily.  Urinary incontinence- mixed stress and urge.  Wearing a pad daily.  S/p bladder surgery, 'it only lasted 10 months'.  Has never tried medication.  L hip pain- pt was injured 6 weeks ago.  Felt something pull while she was bracing herself against an angry child.  No bony injury, no fall.  Not taking OTC pain relievers.  'it's really not that bad' but worse w/ prolonged sitting or lying on L side.    Review of Systems For ROS see HPI     Objective:   Physical Exam  Constitutional: She is oriented to person, place, and time. She appears well-developed and well-nourished. No distress.  HENT:  Head: Normocephalic and atraumatic.  Eyes: Conjunctivae and EOM are normal. Pupils are equal, round, and reactive to light.  Neck: Normal range of motion. Neck supple. No thyromegaly present.  Cardiovascular: Normal rate, regular rhythm, normal heart sounds and intact distal pulses.   No murmur heard. Pulmonary/Chest: Effort normal and breath sounds normal. No respiratory distress.  Abdominal: Soft. She exhibits no distension. There is no tenderness.  Musculoskeletal: She exhibits no edema or tenderness (no TTP over L trochanteric bursa).  Full ROM of L hip  Lymphadenopathy:    She has no cervical adenopathy.  Neurological: She is alert and oriented to person,  place, and time.  Skin: Skin is warm and dry.  Fungal dermatitis w/ skin breakdown under R breast  Psychiatric: She has a normal mood and affect. Her behavior is normal.  Vitals reviewed.         Assessment & Plan:

## 2015-03-25 NOTE — Patient Instructions (Signed)
Schedule your complete physical in 6 months We'll notify you of your lab results and make any changes if needed Continue to work on healthy diet and regular exercise- you can do it! Apply the Lotrisone cream twice daily.  If no improvement in your rash after 2 weeks- please let me know Start the Vesicare once daily.  If no improvement after 1 month, let me know so we can refer to urology for the bladder leakage For the hip pain- apply heat and take tylenol/ibuprofen as needed.  If no improvement in the next few weeks, let me know Call with any questions or concerns If you want to join [place] at the new [place] office, any scheduled appointments will automatically transfer and we will see you at [address] [place] [address] Bianca Hogan, [place] [address]  Happy Holidays!!!

## 2015-03-25 NOTE — Assessment & Plan Note (Signed)
New to provider, ongoing for pt.  She has seen dermatology previously w/o improvement.  Start Lotrisone cream twice daily.  Reviewed supportive care and red flags that should prompt return.  Pt expressed understanding and is in agreement w/ plan.

## 2015-03-31 ENCOUNTER — Ambulatory Visit
Admission: RE | Admit: 2015-03-31 | Discharge: 2015-03-31 | Disposition: A | Discharge status: Home / Self Care | Payer: BC Managed Care – PPO | Source: Ambulatory Visit

## 2015-03-31 DIAGNOSIS — Z1231 Encounter for screening mammogram for malignant neoplasm of breast: Secondary | ICD-10-CM

## 2015-05-26 ENCOUNTER — Other Ambulatory Visit: Payer: Self-pay | Admitting: Family Medicine

## 2015-06-13 ENCOUNTER — Other Ambulatory Visit: Payer: Self-pay | Admitting: Family Medicine

## 2015-06-21 ENCOUNTER — Other Ambulatory Visit: Payer: Self-pay | Admitting: Family Medicine

## 2015-06-22 NOTE — Telephone Encounter (Signed)
Medication filled to pharmacy as requested.   

## 2015-07-18 ENCOUNTER — Other Ambulatory Visit: Payer: Self-pay | Admitting: Family Medicine

## 2015-07-20 ENCOUNTER — Other Ambulatory Visit: Payer: Self-pay | Admitting: General Practice

## 2015-07-20 MED ORDER — FLUTICASONE PROPIONATE 50 MCG/ACT NA SUSP: NASAL | Status: DC

## 2015-07-20 NOTE — Telephone Encounter (Signed)
Medication filled to pharmacy as requested.   

## 2015-07-24 ENCOUNTER — Ambulatory Visit (INDEPENDENT_AMBULATORY_CARE_PROVIDER_SITE_OTHER): Payer: BC Managed Care – PPO | Admitting: Family Medicine

## 2015-07-24 ENCOUNTER — Encounter: Payer: Self-pay | Admitting: Family Medicine

## 2015-07-24 VITALS — BP 140/84 | HR 90 | Temp 98.0°F | Resp 17 | Wt 192.4 lb

## 2015-07-24 DIAGNOSIS — J320 Chronic maxillary sinusitis: Secondary | ICD-10-CM | POA: Insufficient documentation

## 2015-07-24 DIAGNOSIS — J0100 Acute maxillary sinusitis, unspecified: Secondary | ICD-10-CM

## 2015-07-24 MED ORDER — AMOXICILLIN 875 MG PO TABS: 875.0000 mg | ORAL_TABLET | Freq: Two times a day (BID) | ORAL | Status: DC

## 2015-07-24 NOTE — Assessment & Plan Note (Signed)
Pt's sxs and PE consistent w/ early maxillary sinusitis.  Due to fact that pt is leaving for [place] to care for grandchildren will start abx rather than wait for worsening sxs.  Continue allergy medication daily.  Reviewed supportive care and red flags that should prompt return.  Pt expressed understanding and is in agreement w/ plan.

## 2015-07-24 NOTE — Progress Notes (Signed)
   Subjective:    Patient ID: Bianca Hogan, female    DOB: 23-Apr-1956, 60 y.o.   MRN: [id number]  HPI URI- sxs started Wednesday w/ nasal congestion, drainage.  R ear is full.  Increased eye itchiness and watery.  Cough is productive.  Sinuses 'feel full'.  No fevers.  + sick contacts.  Using Flonase, Claritin/Claritin D, Netti Pot.  + hoarseness.  Pt reports she was wheezing earlier.   Review of Systems For ROS see HPI     Objective:   Physical Exam  Constitutional: She appears well-developed and well-nourished. No distress.  HENT:  Head: Normocephalic and atraumatic.  Right Ear: Tympanic membrane normal.  Left Ear: Tympanic membrane normal.  Nose: Mucosal edema and rhinorrhea present. Right sinus exhibits maxillary sinus tenderness. Left sinus exhibits maxillary sinus tenderness. Left sinus exhibits no frontal sinus tenderness.  Mouth/Throat: Uvula is midline and mucous membranes are normal. Posterior oropharyngeal erythema present. No oropharyngeal exudate.  Eyes: Conjunctivae and EOM are normal. Pupils are equal, round, and reactive to light.  Neck: Normal range of motion. Neck supple.  Cardiovascular: Normal rate, regular rhythm and normal heart sounds.   Pulmonary/Chest: Effort normal and breath sounds normal. No respiratory distress. She has no wheezes.  Lymphadenopathy:    She has no cervical adenopathy.  Vitals reviewed.         Assessment & Plan:

## 2015-07-24 NOTE — Patient Instructions (Signed)
Follow up as needed Drink plenty of fluids REST! Start the Amoxicillin twice daily- take w/ food Continue the Claritin or ClaritinD daily Mucinex DM for cough/congestion Continue the Flonase- 2 sprays each nostril daily Call with any questions or concerns If you want to join [place] at the new [place] office, any scheduled appointments will automatically transfer and we will see you at [address] [place] [address] [place], [place], [place] [address] ([place]) [place] in there!!!

## 2015-07-24 NOTE — Progress Notes (Signed)
Pre visit review using our clinic review tool, if applicable. No additional management support is needed unless otherwise documented below in the visit note. 

## 2015-08-25 ENCOUNTER — Other Ambulatory Visit: Payer: Self-pay | Admitting: Family Medicine

## 2015-08-25 NOTE — Telephone Encounter (Signed)
Medication filled to pharmacy as requested.   

## 2015-09-06 ENCOUNTER — Other Ambulatory Visit: Payer: Self-pay | Admitting: Family Medicine

## 2015-09-07 NOTE — Telephone Encounter (Signed)
Medication filled to pharmacy as requested.   

## 2015-09-14 ENCOUNTER — Other Ambulatory Visit: Payer: Self-pay | Admitting: Family Medicine

## 2015-09-14 NOTE — Telephone Encounter (Signed)
Medication filled to pharmacy as requested.   

## 2015-09-15 ENCOUNTER — Other Ambulatory Visit: Payer: Self-pay | Admitting: Family Medicine

## 2015-09-16 NOTE — Telephone Encounter (Signed)
Medication filled to pharmacy as requested.   

## 2015-09-23 ENCOUNTER — Encounter: Payer: Self-pay | Admitting: Family Medicine

## 2015-09-23 ENCOUNTER — Ambulatory Visit (INDEPENDENT_AMBULATORY_CARE_PROVIDER_SITE_OTHER): Payer: BC Managed Care – PPO | Admitting: Family Medicine

## 2015-09-23 VITALS — BP 130/80 | HR 76 | Temp 98.0°F | Resp 16 | Ht 65.0 in | Wt 187.2 lb

## 2015-09-23 DIAGNOSIS — Z0000 Encounter for general adult medical examination without abnormal findings: Secondary | ICD-10-CM

## 2015-09-23 DIAGNOSIS — Z86010 Personal history of colonic polyps: Secondary | ICD-10-CM

## 2015-09-23 LAB — CBC WITH DIFFERENTIAL/PLATELET
BASOS PCT: 0.3 % (ref 0.0–3.0)
Basophils Absolute: 0 10*3/uL (ref 0.0–0.1)
EOS PCT: 3.2 % (ref 0.0–5.0)
Eosinophils Absolute: 0.2 10*3/uL (ref 0.0–0.7)
HEMATOCRIT: 44.5 % (ref 36.0–46.0)
HEMOGLOBIN: 15.1 g/dL — AB (ref 12.0–15.0)
LYMPHS PCT: 27.6 % (ref 12.0–46.0)
Lymphs Abs: 1.7 10*3/uL (ref 0.7–4.0)
MCHC: 34 g/dL (ref 30.0–36.0)
MCV: 91.4 fl (ref 78.0–100.0)
MONOS PCT: 7.9 % (ref 3.0–12.0)
Monocytes Absolute: 0.5 10*3/uL (ref 0.1–1.0)
NEUTROS ABS: 3.8 10*3/uL (ref 1.4–7.7)
Neutrophils Relative %: 61 % (ref 43.0–77.0)
Platelets: 234 10*3/uL (ref 150.0–400.0)
RBC: 4.86 Mil/uL (ref 3.87–5.11)
RDW: 13.5 % (ref 11.5–15.5)
WBC: 6.2 10*3/uL (ref 4.0–10.5)

## 2015-09-23 LAB — LIPID PANEL
CHOL/HDL RATIO: 3
Cholesterol: 205 mg/dL — ABNORMAL HIGH (ref 0–200)
HDL: 62.4 mg/dL (ref >39.00)
LDL CALC: 126 mg/dL — AB (ref 0–99)
NonHDL: 143.02
TRIGLYCERIDES: 83 mg/dL (ref 0.0–149.0)
VLDL: 16.6 mg/dL (ref 0.0–40.0)

## 2015-09-23 LAB — BASIC METABOLIC PANEL
BUN: 11 mg/dL (ref 6–23)
CALCIUM: 8.4 mg/dL (ref 8.4–10.5)
CO2: 28 mEq/L (ref 19–32)
CREATININE: 0.67 mg/dL (ref 0.40–1.20)
Chloride: 105 mEq/L (ref 96–112)
GFR: 95.35 mL/min (ref >60.00)
Glucose, Bld: 96 mg/dL (ref 70–99)
Potassium: 3.5 mEq/L (ref 3.5–5.1)
Sodium: 140 mEq/L (ref 135–145)

## 2015-09-23 LAB — HEPATIC FUNCTION PANEL
ALT: 25 U/L (ref 0–35)
AST: 21 U/L (ref 0–37)
Albumin: 3.4 g/dL — ABNORMAL LOW (ref 3.5–5.2)
Alkaline Phosphatase: 57 U/L (ref 39–117)
BILIRUBIN DIRECT: 0.1 mg/dL (ref 0.0–0.3)
BILIRUBIN TOTAL: 0.5 mg/dL (ref 0.2–1.2)
Total Protein: 5.5 g/dL — ABNORMAL LOW (ref 6.0–8.3)

## 2015-09-23 LAB — VITAMIN D 25 HYDROXY (VIT D DEFICIENCY, FRACTURES): VITD: 14.89 ng/mL — ABNORMAL LOW (ref 30.00–100.00)

## 2015-09-23 LAB — TSH: TSH: 1.57 u[IU]/mL (ref 0.35–4.50)

## 2015-09-23 NOTE — Progress Notes (Signed)
Pre visit review using our clinic review tool, if applicable. No additional management support is needed unless otherwise documented below in the visit note. 

## 2015-09-23 NOTE — Assessment & Plan Note (Signed)
Pt's PE WNL and unchanged from previous.  UTD on mammo.  Due for recall colonoscopy.  No need for paps due to hysterectomy.  UTD on Tdap.  Check labs.  Anticipatory guidance provided.

## 2015-09-23 NOTE — Progress Notes (Signed)
   Subjective:    Patient ID: Bianca Hogan, female    DOB: June 05, 1955, 60 y.o.   MRN: [id number]  HPI CPE- due for recall colonoscopy this year Henrene Pastor).  UTD on mammo.  Has DEXA scheduled.  UTD on immunizations.  Pt has lost 5 lbs since last visit.   Review of Systems Patient reports no vision changes, adenopathy,fever, weight change,  persistant/recurrent hoarseness , swallowing issues, chest pain, palpitations, edema, persistant/recurrent cough, hemoptysis, dyspnea (rest/exertional/paroxysmal nocturnal), gastrointestinal bleeding (melena, rectal bleeding), abdominal pain, significant heartburn, bowel changes, GU symptoms (dysuria, hematuria, incontinence), Gyn symptoms (abnormal  bleeding, pain),  syncope, focal weakness, memory loss, numbness & tingling, skin/hair/nail changes, abnormal bruising or bleeding, anxiety, or depression.   + decreased hearing    Objective:   Physical Exam General Appearance:    Alert, cooperative, no distress, appears stated age  Head:    Normocephalic, without obvious abnormality, atraumatic  Eyes:    PERRL, conjunctiva/corneas clear, EOM's intact, fundi    benign, both eyes  Ears:    Normal TM's and external ear canals, both ears  Nose:   Nares normal, septum midline, mucosa normal, no drainage    or sinus tenderness  Throat:   Lips, mucosa, and tongue normal; teeth and gums normal  Neck:   Supple, symmetrical, trachea midline, no adenopathy;    Thyroid: no enlargement/tenderness/nodules  Back:     Symmetric, no curvature, ROM normal, no CVA tenderness  Lungs:     Clear to auscultation bilaterally, respirations unlabored  Chest Wall:    No tenderness or deformity   Heart:    Regular rate and rhythm, S1 and S2 normal, no murmur, rub   or gallop  Breast Exam:    Deferred to mammo  Abdomen:     Soft, non-tender, bowel sounds active all four quadrants,    no masses, no organomegaly  Genitalia:    Deferred due to hysterectomy  Rectal:    Extremities:    Extremities normal, atraumatic, no cyanosis or edema  Pulses:   2+ and symmetric all extremities  Skin:   Skin color, texture, turgor normal, no rashes or lesions  Lymph nodes:   Cervical, supraclavicular, and axillary nodes normal  Neurologic:   CNII-XII intact, normal strength, sensation and reflexes    throughout          Assessment & Plan:

## 2015-09-23 NOTE — Patient Instructions (Signed)
Follow up in 6 months to recheck BP and cholesterol We'll notify you of your lab results and make any changes if needed Keep up the good work on healthy diet and regular exercise- you look great! GI should be contacting you regarding the colonoscopy Use OTC Clotrimazole cream twice daily on the rash under the breasts- using the Lotrisone combo cream only if sxs are severe Call with any questions or concerns Thanks for sticking with [place]!! Enjoy your trip!!!

## 2015-09-24 ENCOUNTER — Other Ambulatory Visit: Payer: Self-pay | Admitting: Family Medicine

## 2015-09-24 ENCOUNTER — Other Ambulatory Visit: Payer: Self-pay | Admitting: General Practice

## 2015-09-24 DIAGNOSIS — E2839 Other primary ovarian failure: Secondary | ICD-10-CM

## 2015-09-24 MED ORDER — VITAMIN D (ERGOCALCIFEROL) 1.25 MG (50000 UNIT) PO CAPS: 50000.0000 [IU] | ORAL_CAPSULE | ORAL | Status: DC

## 2015-09-29 ENCOUNTER — Other Ambulatory Visit: Payer: BC Managed Care – PPO

## 2015-09-29 ENCOUNTER — Encounter: Payer: Self-pay | Admitting: Internal Medicine

## 2015-09-29 ENCOUNTER — Ambulatory Visit
Admission: RE | Admit: 2015-09-29 | Discharge: 2015-09-29 | Disposition: A | Discharge status: Home / Self Care | Payer: BC Managed Care – PPO | Source: Ambulatory Visit | Attending: Family Medicine | Admitting: Family Medicine

## 2015-09-29 DIAGNOSIS — E2839 Other primary ovarian failure: Secondary | ICD-10-CM

## 2015-10-10 ENCOUNTER — Other Ambulatory Visit: Payer: Self-pay | Admitting: Family Medicine

## 2015-10-13 NOTE — Telephone Encounter (Signed)
Medication filled to pharmacy as requested.   

## 2015-10-20 ENCOUNTER — Encounter: Payer: Self-pay | Admitting: Internal Medicine

## 2015-11-18 ENCOUNTER — Ambulatory Visit (AMBULATORY_SURGERY_CENTER): Payer: Self-pay

## 2015-11-18 VITALS — Ht 64.75 in | Wt 194.4 lb

## 2015-11-18 DIAGNOSIS — Z860100 Personal history of colon polyps, unspecified: Secondary | ICD-10-CM

## 2015-11-18 MED ORDER — SUPREP BOWEL PREP KIT 17.5-3.13-1.6 GM/177ML PO SOLN: 1.0000 | Freq: Once | ORAL | Status: DC

## 2015-12-14 ENCOUNTER — Telehealth: Payer: Self-pay | Admitting: Family Medicine

## 2015-12-14 NOTE — Telephone Encounter (Signed)
She should continue to take daily OTC supplement of 5,000 units daily but no need for refill on prescription

## 2015-12-14 NOTE — Telephone Encounter (Signed)
Vit D 14.89 on 09/24/15.

## 2015-12-14 NOTE — Telephone Encounter (Signed)
Pt states that she has no refills for her Vit D and asking if [facility] is wanting her to stop taking this or does a Rx need to be called in.

## 2015-12-15 NOTE — Telephone Encounter (Signed)
Patient aware of new medication instructions.

## 2015-12-18 ENCOUNTER — Encounter: Payer: Self-pay | Admitting: Internal Medicine

## 2015-12-18 ENCOUNTER — Ambulatory Visit (AMBULATORY_SURGERY_CENTER): Payer: BC Managed Care – PPO | Admitting: Internal Medicine

## 2015-12-18 VITALS — BP 128/76 | HR 56 | Temp 97.7°F | Resp 16 | Ht 65.0 in | Wt 194.0 lb

## 2015-12-18 DIAGNOSIS — Z86010 Personal history of colonic polyps: Secondary | ICD-10-CM

## 2015-12-18 DIAGNOSIS — D123 Benign neoplasm of transverse colon: Secondary | ICD-10-CM | POA: Diagnosis not present

## 2015-12-18 HISTORY — PX: COLONOSCOPY: SHX174

## 2015-12-18 MED ORDER — SODIUM CHLORIDE 0.9 % IV SOLN: 500.0000 mL | INTRAVENOUS | Status: DC

## 2015-12-18 NOTE — Patient Instructions (Signed)
YOU HAD AN ENDOSCOPIC PROCEDURE TODAY AT [facility]:   Refer to the procedure report that was given to you for any specific questions about what was found during the examination.  If the procedure report does not answer your questions, please call your gastroenterologist to clarify.  If you requested that your care partner not be given the details of your procedure findings, then the procedure report has been included in a sealed envelope for you to review at your convenience later.  YOU SHOULD EXPECT: Some feelings of bloating in the abdomen. Passage of more gas than usual.  Walking can help get rid of the air that was put into your GI tract during the procedure and reduce the bloating. If you had a lower endoscopy (such as a colonoscopy or flexible sigmoidoscopy) you may notice spotting of blood in your stool or on the toilet paper. If you underwent a bowel prep for your procedure, you may not have a normal bowel movement for a few days.  Please Note:  You might notice some irritation and congestion in your nose or some drainage.  This is from the oxygen used during your procedure.  There is no need for concern and it should clear up in a day or so.  SYMPTOMS TO REPORT IMMEDIATELY:   Following lower endoscopy (colonoscopy or flexible sigmoidoscopy):  Excessive amounts of blood in the stool  Significant tenderness or worsening of abdominal pains  Swelling of the abdomen that is new, acute  Fever of 100F or higher    For urgent or emergent issues, a gastroenterologist can be reached at any hour by calling [phone].   DIET: Your first meal following the procedure should be a small meal and then it is ok to progress to your normal diet. Heavy or fried foods are harder to digest and may make you feel nauseous or bloated.  Likewise, meals heavy in dairy and vegetables can increase bloating.  Drink plenty of fluids but you should avoid alcoholic beverages for 24  hours.  ACTIVITY:  You should plan to take it easy for the rest of today and you should NOT DRIVE or use heavy machinery until tomorrow (because of the sedation medicines used during the test).    FOLLOW UP: Our staff will call the number listed on your records the next business day following your procedure to check on you and address any questions or concerns that you may have regarding the information given to you following your procedure. If we do not reach you, we will leave a message.  However, if you are feeling well and you are not experiencing any problems, there is no need to return our call.  We will assume that you have returned to your regular daily activities without incident.  If any biopsies were taken you will be contacted by phone or by letter within the next 1-3 weeks.  Please call us at [phone] if you have not heard about the biopsies in 3 weeks.    SIGNATURES/CONFIDENTIALITY: You and/or your care partner have signed paperwork which will be entered into your electronic medical record.  These signatures attest to the fact that that the information above on your After Visit Summary has been reviewed and is understood.  Full responsibility of the confidentiality of this discharge information lies with you and/or your care-partner.   INFORMATION ON POLYPS,DIVERTICULOSIS,HEMORRHOIDS,AND HIGH FIBER DIET GIVEN TO YOU TODAY

## 2015-12-18 NOTE — Op Note (Signed)
[facility] Patient Name: Bianca Hogan Procedure Date: 12/18/2015 11:14 AM MRN: [id number] Endoscopist: Docia Chuck. Henrene Pastor , MD Age: 60 Referring MD:  Date of Birth: 26-Dec-1955 Gender: Female Account #: [id number] Procedure:                Colonoscopy, with cold snare polypectomy x 1 Indications:              Surveillance: Personal history of adenomatous                            polyps on last colonoscopy 5 years ago. Index                            examination in [facility] with                            proximal colonic polyps. Follow-up here 2012 with                            tubular adenoma. Medicines:                Monitored Anesthesia Care Procedure:                Pre-Anesthesia Assessment:                           - Prior to the procedure, a History and Physical                            was performed, and patient medications and                            allergies were reviewed. The patient's tolerance of                            previous anesthesia was also reviewed. The risks                            and benefits of the procedure and the sedation                            options and risks were discussed with the patient.                            All questions were answered, and informed consent                            was obtained. Prior Anticoagulants: The patient has                            taken no previous anticoagulant or antiplatelet                            agents. ASA Grade Assessment: II - A patient with  mild systemic disease. After reviewing the risks                            and benefits, the patient was deemed in                            satisfactory condition to undergo the procedure.                           After obtaining informed consent, the colonoscope                            was passed under direct vision. Throughout the                            procedure, the patient's  blood pressure, pulse, and                            oxygen saturations were monitored continuously. The                            Model CF-HQ190L [id number]) scope was introduced                            through the anus and advanced to the the cecum,                            identified by appendiceal orifice and ileocecal                            valve. The ileocecal valve, appendiceal orifice,                            and rectum were photographed. The quality of the                            bowel preparation was excellent. The colonoscopy                            was performed without difficulty. The patient                            tolerated the procedure well. The bowel preparation                            used was SUPREP. Scope In: 11:25:06 AM Scope Out: 11:35:30 AM Scope Withdrawal Time: 0 hours 8 minutes 5 seconds  Total Procedure Duration: 0 hours 10 minutes 24 seconds  Findings:                 A 5 mm polyp was found in the transverse colon. The                            polyp was sessile. The polyp was removed with a  cold snare. Resection and retrieval were complete.                           A few diverticula were found in the left colon.                           Internal hemorrhoids were found during                            retroflexion. The hemorrhoids were small.                           The exam was otherwise without abnormality on                            direct and retroflexion views. Complications:            No immediate complications. Estimated blood loss:                            None. Estimated Blood Loss:     Estimated blood loss: none. Impression:               - One 5 mm polyp in the transverse colon, removed                            with a cold snare. Resected and retrieved.                           - Diverticulosis in the left colon.                           - Internal hemorrhoids.                            - The examination was otherwise normal on direct                            and retroflexion views. Recommendation:           - Repeat colonoscopy in 5 years for surveillance.                           - Patient has a contact number available for                            emergencies. The signs and symptoms of potential                            delayed complications were discussed with the                            patient. Return to normal activities tomorrow.                            Written discharge instructions were provided to the  patient.                           - Resume previous diet.                           - Continue present medications.                           - Await pathology results. Docia Chuck. Henrene Pastor, MD 12/18/2015 11:39:55 AM This report has been signed electronically.

## 2015-12-18 NOTE — Progress Notes (Signed)
To recovery, report to [place], RN, VSS 

## 2015-12-18 NOTE — Progress Notes (Signed)
Called to room to assist during endoscopic procedure.  Patient ID and intended procedure confirmed with present staff. Received instructions for my participation in the procedure from the performing physician.  

## 2015-12-21 ENCOUNTER — Telehealth: Payer: Self-pay

## 2015-12-21 NOTE — Telephone Encounter (Signed)
  Follow up Call-  Call back number 12/18/2015  Post procedure Call Back phone  # [phone]  Permission to leave phone message Yes  Some recent data might be hidden     Patient questions:  Do you have a fever, pain , or abdominal swelling? No. Pain Score  0 *  Have you tolerated food without any problems? Yes.    Have you been able to return to your normal activities? Yes.    Do you have any questions about your discharge instructions: Diet   No. Medications  No. Follow up visit  No.  Do you have questions or concerns about your Care? No.  Actions: * If pain score is 4 or above: No action needed, pain <4.

## 2015-12-29 ENCOUNTER — Encounter: Payer: Self-pay | Admitting: Family Medicine

## 2015-12-29 ENCOUNTER — Encounter: Payer: Self-pay | Admitting: Internal Medicine

## 2016-01-03 ENCOUNTER — Other Ambulatory Visit: Payer: Self-pay | Admitting: Family Medicine

## 2016-01-21 ENCOUNTER — Other Ambulatory Visit: Payer: Self-pay | Admitting: General Practice

## 2016-01-21 MED ORDER — LOSARTAN POTASSIUM 50 MG PO TABS: 50.0000 mg | ORAL_TABLET | Freq: Every day | ORAL | 1 refills | Status: DC

## 2016-03-04 ENCOUNTER — Telehealth: Payer: Self-pay | Admitting: Family Medicine

## 2016-03-04 NOTE — Telephone Encounter (Signed)
The doses of medication that she takes are not going to harm her by doubling up today but she should only take medications once daily.  She can take tomorrow's doses as scheduled.

## 2016-03-04 NOTE — Telephone Encounter (Signed)
Patient notified of PCP recommendations and is agreement and expresses an understanding.  

## 2016-03-04 NOTE — Telephone Encounter (Signed)
Patient Name: Bianca Hogan  DOB: 10-02-1955    Initial Comment Caller states she took two days worth of medication this morning.   Nurse Assessment  Nurse: Raphael Gibney, RN, Vanita Ingles Date/Time (Eastern Time): 03/04/2016 8:20:55 AM  Confirm and document reason for call. If symptomatic, describe symptoms. You must click the next button to save text entered. ---Caller states she has taken 2 days worth of medication this am. she has taken pantoprazole; Losartan; estradiol; simvastatin which she takes daily. She is anxious. No dizziness or SOB.  Has the patient traveled out of the country within the last 30 days? ---Not Applicable  Does the patient have any new or worsening symptoms? ---Yes  Will a triage be completed? ---Yes  Related visit to physician within the last 2 weeks? ---No  Does the PT have any chronic conditions? (i.e. diabetes, asthma, etc.) ---Yes  List chronic conditions. ---HTN  Is this a behavioral health or substance abuse call? ---No     Guidelines    Guideline Title Affirmed Question Affirmed Notes  Poisoning [1] DOUBLE DOSE (an extra dose or lesser amount) of prescription drug AND [2] NO symptoms    Final Disposition User   Call PCP Now Raphael Gibney, RN, [place] office and spoke to [place] and gave report that pt took double dose of simvastatin; losartan; estradiol; pantoprazole which she takes daily. took today's dose and a little while later took Saturdays dose. She is feeling fine other than a little anxiety. She would like to know what to do about tomorrow dose. Please call pt back regarding tomorrow's dose.   Referrals  REFERRED TO PCP OFFICE   Disagree/Comply: Comply

## 2016-03-07 ENCOUNTER — Other Ambulatory Visit: Payer: Self-pay | Admitting: Family Medicine

## 2016-03-07 DIAGNOSIS — Z1231 Encounter for screening mammogram for malignant neoplasm of breast: Secondary | ICD-10-CM

## 2016-03-22 ENCOUNTER — Encounter: Payer: Self-pay | Admitting: Family Medicine

## 2016-03-29 ENCOUNTER — Other Ambulatory Visit: Payer: Self-pay | Admitting: Emergency Medicine

## 2016-03-29 ENCOUNTER — Ambulatory Visit (INDEPENDENT_AMBULATORY_CARE_PROVIDER_SITE_OTHER): Payer: BC Managed Care – PPO | Admitting: Family Medicine

## 2016-03-29 ENCOUNTER — Encounter: Payer: Self-pay | Admitting: Family Medicine

## 2016-03-29 VITALS — BP 124/76 | HR 76 | Temp 98.6°F | Resp 17 | Ht 65.0 in | Wt 195.2 lb

## 2016-03-29 DIAGNOSIS — B351 Tinea unguium: Secondary | ICD-10-CM | POA: Insufficient documentation

## 2016-03-29 DIAGNOSIS — I10 Essential (primary) hypertension: Secondary | ICD-10-CM | POA: Diagnosis not present

## 2016-03-29 DIAGNOSIS — E785 Hyperlipidemia, unspecified: Secondary | ICD-10-CM

## 2016-03-29 DIAGNOSIS — Z23 Encounter for immunization: Secondary | ICD-10-CM | POA: Diagnosis not present

## 2016-03-29 DIAGNOSIS — Z1159 Encounter for screening for other viral diseases: Secondary | ICD-10-CM

## 2016-03-29 DIAGNOSIS — Z2911 Encounter for prophylactic immunotherapy for respiratory syncytial virus (RSV): Secondary | ICD-10-CM

## 2016-03-29 LAB — CBC WITH DIFFERENTIAL/PLATELET
BASOS PCT: 0.3 % (ref 0.0–3.0)
Basophils Absolute: 0 10*3/uL (ref 0.0–0.1)
EOS ABS: 0.1 10*3/uL (ref 0.0–0.7)
Eosinophils Relative: 2.5 % (ref 0.0–5.0)
HCT: 43.3 % (ref 36.0–46.0)
HEMOGLOBIN: 14.7 g/dL (ref 12.0–15.0)
Lymphocytes Relative: 29.1 % (ref 12.0–46.0)
Lymphs Abs: 1.6 10*3/uL (ref 0.7–4.0)
MCHC: 34 g/dL (ref 30.0–36.0)
MCV: 93.1 fl (ref 78.0–100.0)
MONO ABS: 0.5 10*3/uL (ref 0.1–1.0)
Monocytes Relative: 8.9 % (ref 3.0–12.0)
NEUTROS ABS: 3.2 10*3/uL (ref 1.4–7.7)
Neutrophils Relative %: 59.2 % (ref 43.0–77.0)
PLATELETS: 251 10*3/uL (ref 150.0–400.0)
RBC: 4.65 Mil/uL (ref 3.87–5.11)
RDW: 12.8 % (ref 11.5–15.5)
WBC: 5.4 10*3/uL (ref 4.0–10.5)

## 2016-03-29 LAB — LIPID PANEL
Cholesterol: 232 mg/dL — ABNORMAL HIGH (ref 0–200)
HDL: 69.9 mg/dL (ref >39.00)
LDL CALC: 143 mg/dL — AB (ref 0–99)
NONHDL: 161.61
Total CHOL/HDL Ratio: 3
Triglycerides: 93 mg/dL (ref 0.0–149.0)
VLDL: 18.6 mg/dL (ref 0.0–40.0)

## 2016-03-29 LAB — BASIC METABOLIC PANEL
BUN: 12 mg/dL (ref 6–23)
CALCIUM: 9.2 mg/dL (ref 8.4–10.5)
CO2: 28 mEq/L (ref 19–32)
CREATININE: 0.67 mg/dL (ref 0.40–1.20)
Chloride: 104 mEq/L (ref 96–112)
GFR: 95.19 mL/min (ref >60.00)
GLUCOSE: 93 mg/dL (ref 70–99)
Potassium: 4.6 mEq/L (ref 3.5–5.1)
SODIUM: 139 meq/L (ref 135–145)

## 2016-03-29 LAB — HEPATIC FUNCTION PANEL
ALBUMIN: 3.8 g/dL (ref 3.5–5.2)
ALK PHOS: 80 U/L (ref 39–117)
ALT: 19 U/L (ref 0–35)
AST: 18 U/L (ref 0–37)
BILIRUBIN DIRECT: 0.1 mg/dL (ref 0.0–0.3)
Total Bilirubin: 0.4 mg/dL (ref 0.2–1.2)
Total Protein: 6.4 g/dL (ref 6.0–8.3)

## 2016-03-29 MED ORDER — CICLOPIROX 8 % EX SOLN: Freq: Every day | CUTANEOUS | 3 refills | Status: DC

## 2016-03-29 NOTE — Assessment & Plan Note (Signed)
New to provider, ongoing for pt.  No improvement w/ FungiNail.  Start PenLac.  Reviewed supportive care and red flags that should prompt return.  Pt expressed understanding and is in agreement w/ plan.

## 2016-03-29 NOTE — Assessment & Plan Note (Signed)
Chronic problem.  Tolerating statin w/o difficulty.  Check labs.  Adjust meds prn  

## 2016-03-29 NOTE — Progress Notes (Signed)
Pre visit review using our clinic review tool, if applicable. No additional management support is needed unless otherwise documented below in the visit note. 

## 2016-03-29 NOTE — Assessment & Plan Note (Signed)
Chronic problem.  Well controlled today.  Asymptomatic.  Check labs.  No anticipated med changes. 

## 2016-03-29 NOTE — Patient Instructions (Signed)
Schedule your complete physical in 6 months We'll notify you of your lab results and make any changes if needed Continue to work on healthy diet and regular exercise- you look great! Use the PenLac as directed for the nail fungus Call with any questions or concerns Happy Holidays!!!

## 2016-03-29 NOTE — Progress Notes (Signed)
   Subjective:    Patient ID: Bianca Hogan, female    DOB: [birth year], 60 y.o.   MRN: [id number]  HPI HTN- chronic problem, on Losartan.  Denies CP, SOB, HAs, visual changes, edema.  Hyperlipidemia- chronic problem, on Simvastatin daily.  Denies abd pain, N/V, myalgias.  Exercising regularly.  Toenail fungus- not improving w/ OTC Fungi-nail tx.  Pt having difficult time cutting nails due to thickness   Review of Systems For ROS see HPI     Objective:   Physical Exam  Constitutional: She is oriented to person, place, and time. She appears well-developed and well-nourished. No distress.  HENT:  Head: Normocephalic and atraumatic.  Eyes: Conjunctivae and EOM are normal. Pupils are equal, round, and reactive to light.  Neck: Normal range of motion. Neck supple. No thyromegaly present.  Cardiovascular: Normal rate, regular rhythm, normal heart sounds and intact distal pulses.   No murmur heard. Pulmonary/Chest: Effort normal and breath sounds normal. No respiratory distress.  Abdominal: Soft. She exhibits no distension. There is no tenderness.  Musculoskeletal: She exhibits no edema.  Lymphadenopathy:    She has no cervical adenopathy.  Neurological: She is alert and oriented to person, place, and time.  Skin: Skin is warm and dry.  onychomycosis of toenails  Psychiatric: She has a normal mood and affect. Her behavior is normal.  Vitals reviewed.         Assessment & Plan:

## 2016-03-30 ENCOUNTER — Other Ambulatory Visit: Payer: Self-pay | Admitting: General Practice

## 2016-03-30 LAB — HEPATITIS C ANTIBODY: HCV AB: NEGATIVE

## 2016-03-30 MED ORDER — ATORVASTATIN CALCIUM 20 MG PO TABS: 20.0000 mg | ORAL_TABLET | Freq: Every day | ORAL | 6 refills | Status: DC

## 2016-04-01 ENCOUNTER — Ambulatory Visit
Admission: RE | Admit: 2016-04-01 | Discharge: 2016-04-01 | Disposition: A | Discharge status: Home / Self Care | Payer: BC Managed Care – PPO | Source: Ambulatory Visit | Attending: Family Medicine | Admitting: Family Medicine

## 2016-04-01 DIAGNOSIS — Z1231 Encounter for screening mammogram for malignant neoplasm of breast: Secondary | ICD-10-CM

## 2016-04-04 ENCOUNTER — Other Ambulatory Visit: Payer: Self-pay | Admitting: Family Medicine

## 2016-04-12 ENCOUNTER — Telehealth: Payer: Self-pay | Admitting: General Practice

## 2016-04-12 DIAGNOSIS — R7309 Other abnormal glucose: Secondary | ICD-10-CM

## 2016-04-12 NOTE — Telephone Encounter (Signed)
PA was denied, per insurance they prefer oral medications as first treatment. Pt was informed and denied a podiatry referral. She states that if tabori is ok with her taking the oral medication she will oblige.   Please advise?

## 2016-04-13 MED ORDER — TERBINAFINE HCL 250 MG PO TABS: 250.0000 mg | ORAL_TABLET | Freq: Every day | ORAL | 2 refills | Status: DC

## 2016-04-13 NOTE — Addendum Note (Signed)
Addended by: Desmond Dike L on: 04/13/2016 11:04 AM   Modules accepted: Orders

## 2016-04-13 NOTE — Telephone Encounter (Signed)
Medication was sent to pt local [facility]. Called and LMOVM for pt to return call so that I can discuss with her.

## 2016-04-13 NOTE — Telephone Encounter (Signed)
Pt returned your call.  

## 2016-04-13 NOTE — Telephone Encounter (Signed)
Pt made aware and will pick up meds. Labs ordered and scheduled.

## 2016-04-13 NOTE — Telephone Encounter (Signed)
Ok to start Lamisil 250mg  daily x12 weeks, (#30, 2 refills) but she will need to repeat liver functions at a lab only visit in 4-6 weeks

## 2016-04-24 ENCOUNTER — Other Ambulatory Visit: Payer: Self-pay | Admitting: Family Medicine

## 2016-05-19 ENCOUNTER — Other Ambulatory Visit (INDEPENDENT_AMBULATORY_CARE_PROVIDER_SITE_OTHER): Payer: BC Managed Care – PPO

## 2016-05-19 DIAGNOSIS — R7309 Other abnormal glucose: Secondary | ICD-10-CM | POA: Diagnosis not present

## 2016-05-19 LAB — HEPATIC FUNCTION PANEL
ALBUMIN: 3.8 g/dL (ref 3.5–5.2)
ALT: 19 U/L (ref 0–35)
AST: 16 U/L (ref 0–37)
Alkaline Phosphatase: 82 U/L (ref 39–117)
BILIRUBIN TOTAL: 0.4 mg/dL (ref 0.2–1.2)
Bilirubin, Direct: 0.1 mg/dL (ref 0.0–0.3)
TOTAL PROTEIN: 6.2 g/dL (ref 6.0–8.3)

## 2016-05-23 ENCOUNTER — Other Ambulatory Visit: Payer: Self-pay | Admitting: General Practice

## 2016-05-23 ENCOUNTER — Telehealth: Payer: Self-pay | Admitting: Family Medicine

## 2016-05-23 NOTE — Telephone Encounter (Signed)
Pt is locked out of mychart and wanted to receive update on lab results. Please call back to update on results. Thank you.

## 2016-05-23 NOTE — Telephone Encounter (Signed)
Called pt and gave her the lab results and reset her mychart .

## 2016-05-28 ENCOUNTER — Other Ambulatory Visit: Payer: Self-pay | Admitting: Family Medicine

## 2016-07-10 ENCOUNTER — Other Ambulatory Visit: Payer: Self-pay | Admitting: Family Medicine

## 2016-07-25 ENCOUNTER — Other Ambulatory Visit: Payer: Self-pay | Admitting: Family Medicine

## 2016-08-04 ENCOUNTER — Other Ambulatory Visit: Payer: Self-pay | Admitting: Family Medicine

## 2016-08-28 ENCOUNTER — Other Ambulatory Visit: Payer: Self-pay | Admitting: Family Medicine

## 2016-09-04 ENCOUNTER — Other Ambulatory Visit: Payer: Self-pay | Admitting: Family Medicine

## 2016-09-18 ENCOUNTER — Other Ambulatory Visit: Payer: Self-pay | Admitting: Family Medicine

## 2016-09-28 ENCOUNTER — Encounter: Payer: Self-pay | Admitting: Family Medicine

## 2016-09-28 ENCOUNTER — Ambulatory Visit (INDEPENDENT_AMBULATORY_CARE_PROVIDER_SITE_OTHER): Payer: BC Managed Care – PPO | Admitting: Family Medicine

## 2016-09-28 VITALS — BP 121/82 | HR 70 | Temp 98.7°F | Resp 16 | Ht 65.0 in | Wt 191.4 lb

## 2016-09-28 DIAGNOSIS — E559 Vitamin D deficiency, unspecified: Secondary | ICD-10-CM | POA: Diagnosis not present

## 2016-09-28 DIAGNOSIS — E785 Hyperlipidemia, unspecified: Secondary | ICD-10-CM

## 2016-09-28 DIAGNOSIS — Z0000 Encounter for general adult medical examination without abnormal findings: Secondary | ICD-10-CM

## 2016-09-28 LAB — CBC WITH DIFFERENTIAL/PLATELET
BASOS PCT: 0.5 % (ref 0.0–3.0)
Basophils Absolute: 0 10*3/uL (ref 0.0–0.1)
EOS PCT: 2.4 % (ref 0.0–5.0)
Eosinophils Absolute: 0.1 10*3/uL (ref 0.0–0.7)
HEMATOCRIT: 41.4 % (ref 36.0–46.0)
HEMOGLOBIN: 14 g/dL (ref 12.0–15.0)
LYMPHS PCT: 25.4 % (ref 12.0–46.0)
Lymphs Abs: 1.3 10*3/uL (ref 0.7–4.0)
MCHC: 33.8 g/dL (ref 30.0–36.0)
MCV: 93.9 fl (ref 78.0–100.0)
MONO ABS: 0.5 10*3/uL (ref 0.1–1.0)
MONOS PCT: 9.4 % (ref 3.0–12.0)
Neutro Abs: 3.2 10*3/uL (ref 1.4–7.7)
Neutrophils Relative %: 62.3 % (ref 43.0–77.0)
Platelets: 246 10*3/uL (ref 150.0–400.0)
RBC: 4.41 Mil/uL (ref 3.87–5.11)
RDW: 13.1 % (ref 11.5–15.5)
WBC: 5.2 10*3/uL (ref 4.0–10.5)

## 2016-09-28 LAB — BASIC METABOLIC PANEL
BUN: 14 mg/dL (ref 6–23)
CALCIUM: 9 mg/dL (ref 8.4–10.5)
CHLORIDE: 105 meq/L (ref 96–112)
CO2: 29 meq/L (ref 19–32)
CREATININE: 0.66 mg/dL (ref 0.40–1.20)
GFR: 96.69 mL/min (ref >60.00)
GLUCOSE: 95 mg/dL (ref 70–99)
Potassium: 4.2 mEq/L (ref 3.5–5.1)
SODIUM: 139 meq/L (ref 135–145)

## 2016-09-28 LAB — VITAMIN D 25 HYDROXY (VIT D DEFICIENCY, FRACTURES): VITD: 24.53 ng/mL — ABNORMAL LOW (ref 30.00–100.00)

## 2016-09-28 LAB — LIPID PANEL
CHOL/HDL RATIO: 3
Cholesterol: 194 mg/dL (ref 0–200)
HDL: 74.3 mg/dL (ref >39.00)
LDL CALC: 100 mg/dL — AB (ref 0–99)
NonHDL: 119.81
TRIGLYCERIDES: 101 mg/dL (ref 0.0–149.0)
VLDL: 20.2 mg/dL (ref 0.0–40.0)

## 2016-09-28 LAB — HEPATIC FUNCTION PANEL
ALT: 14 U/L (ref 0–35)
AST: 14 U/L (ref 0–37)
Albumin: 4 g/dL (ref 3.5–5.2)
Alkaline Phosphatase: 80 U/L (ref 39–117)
BILIRUBIN TOTAL: 0.4 mg/dL (ref 0.2–1.2)
Bilirubin, Direct: 0.1 mg/dL (ref 0.0–0.3)
Total Protein: 6.3 g/dL (ref 6.0–8.3)

## 2016-09-28 LAB — TSH: TSH: 1.69 u[IU]/mL (ref 0.35–4.50)

## 2016-09-28 NOTE — Assessment & Plan Note (Signed)
Check labs and replete prn. 

## 2016-09-28 NOTE — Patient Instructions (Signed)
Follow up in 6 months to recheck BP and cholesterol We'll notify you of your lab results and make any changes if needed Continue to work on healthy diet and regular exercise- you look great! You are up to date on mammo, colonoscopy, bone density and tetanus- great job!!! Call with any questions or concerns Have a great summer!!!

## 2016-09-28 NOTE — Assessment & Plan Note (Signed)
Pt's PE WNL w/ exception of obesity.  Pt has lost 4 lbs since last visit- applauded her efforts.  UTD on mammo, colonoscopy, DEXA, Tdap.  Check labs.  Anticipatory guidance provided.

## 2016-09-28 NOTE — Progress Notes (Signed)
Pre visit review using our clinic review tool, if applicable. No additional management support is needed unless otherwise documented below in the visit note. 

## 2016-09-28 NOTE — Progress Notes (Signed)
   Subjective:    Patient ID: Bianca Hogan, female    DOB: [birth year], 61 y.o.   MRN: [id number]  HPI CPE- UTD on colonoscopy, mammo, DEXA, and Tdap.  No need for pap due to hysterectomy.   Review of Systems Patient reports no vision/ hearing changes, adenopathy,fever, weight change,  persistant/recurrent hoarseness , swallowing issues, chest pain, palpitations, edema, persistant/recurrent cough, hemoptysis, dyspnea (rest/exertional/paroxysmal nocturnal), gastrointestinal bleeding (melena, rectal bleeding), abdominal pain, significant heartburn, bowel changes, GU symptoms (dysuria, hematuria, incontinence), Gyn symptoms (abnormal  bleeding, pain),  syncope, focal weakness, memory loss, numbness & tingling, skin/hair/nail changes, abnormal bruising or bleeding, anxiety, or depression.     Objective:   Physical Exam General Appearance:    Alert, cooperative, no distress, appears stated age  Head:    Normocephalic, without obvious abnormality, atraumatic  Eyes:    PERRL, conjunctiva/corneas clear, EOM's intact, fundi    benign, both eyes  Ears:    Normal TM's and external ear canals, both ears  Nose:   Nares normal, septum midline, mucosa normal, no drainage    or sinus tenderness  Throat:   Lips, mucosa, and tongue normal; teeth and gums normal  Neck:   Supple, symmetrical, trachea midline, no adenopathy;    Thyroid: no enlargement/tenderness/nodules  Back:     Symmetric, no curvature, ROM normal, no CVA tenderness  Lungs:     Clear to auscultation bilaterally, respirations unlabored  Chest Wall:    No tenderness or deformity   Heart:    Regular rate and rhythm, S1 and S2 normal, no murmur, rub   or gallop  Breast Exam:    Deferred to mammo  Abdomen:     Soft, non-tender, bowel sounds active all four quadrants,    no masses, no organomegaly  Genitalia:    Deferred  Rectal:    Extremities:   Extremities normal, atraumatic, no cyanosis or edema  Pulses:   2+ and symmetric all  extremities  Skin:   Skin color, texture, turgor normal, no rashes or lesions  Lymph nodes:   Cervical, supraclavicular, and axillary nodes normal  Neurologic:   CNII-XII intact, normal strength, sensation and reflexes    throughout          Assessment & Plan:

## 2016-09-29 ENCOUNTER — Other Ambulatory Visit: Payer: Self-pay | Admitting: General Practice

## 2016-09-29 MED ORDER — VITAMIN D (ERGOCALCIFEROL) 1.25 MG (50000 UNIT) PO CAPS: 50000.0000 [IU] | ORAL_CAPSULE | ORAL | 0 refills | Status: DC

## 2016-10-15 ENCOUNTER — Other Ambulatory Visit: Payer: Self-pay | Admitting: Family Medicine

## 2016-11-11 ENCOUNTER — Other Ambulatory Visit: Payer: Self-pay | Admitting: Family Medicine

## 2016-12-11 ENCOUNTER — Other Ambulatory Visit: Payer: Self-pay | Admitting: Family Medicine

## 2016-12-18 ENCOUNTER — Other Ambulatory Visit: Payer: Self-pay | Admitting: Family Medicine

## 2017-01-16 ENCOUNTER — Other Ambulatory Visit: Payer: Self-pay | Admitting: Family Medicine

## 2017-02-21 ENCOUNTER — Other Ambulatory Visit: Payer: Self-pay | Admitting: Family Medicine

## 2017-02-21 DIAGNOSIS — Z1231 Encounter for screening mammogram for malignant neoplasm of breast: Secondary | ICD-10-CM

## 2017-03-27 ENCOUNTER — Ambulatory Visit (INDEPENDENT_AMBULATORY_CARE_PROVIDER_SITE_OTHER): Payer: BC Managed Care – PPO | Admitting: Family Medicine

## 2017-03-27 ENCOUNTER — Other Ambulatory Visit: Payer: Self-pay

## 2017-03-27 ENCOUNTER — Encounter: Payer: Self-pay | Admitting: General Practice

## 2017-03-27 ENCOUNTER — Encounter: Payer: Self-pay | Admitting: Family Medicine

## 2017-03-27 VITALS — BP 133/82 | HR 81 | Temp 98.2°F | Resp 16 | Ht 65.0 in | Wt 197.2 lb

## 2017-03-27 DIAGNOSIS — E785 Hyperlipidemia, unspecified: Secondary | ICD-10-CM

## 2017-03-27 DIAGNOSIS — M67431 Ganglion, right wrist: Secondary | ICD-10-CM | POA: Insufficient documentation

## 2017-03-27 DIAGNOSIS — I10 Essential (primary) hypertension: Secondary | ICD-10-CM | POA: Diagnosis not present

## 2017-03-27 LAB — LIPID PANEL
CHOL/HDL RATIO: 3
Cholesterol: 185 mg/dL (ref 0–200)
HDL: 55.5 mg/dL (ref >39.00)
LDL CALC: 110 mg/dL — AB (ref 0–99)
NONHDL: 129.72
TRIGLYCERIDES: 101 mg/dL (ref 0.0–149.0)
VLDL: 20.2 mg/dL (ref 0.0–40.0)

## 2017-03-27 LAB — BASIC METABOLIC PANEL
BUN: 11 mg/dL (ref 6–23)
CO2: 29 mEq/L (ref 19–32)
Calcium: 9 mg/dL (ref 8.4–10.5)
Chloride: 105 mEq/L (ref 96–112)
Creatinine, Ser: 0.65 mg/dL (ref 0.40–1.20)
GFR: 98.25 mL/min (ref >60.00)
Glucose, Bld: 107 mg/dL — ABNORMAL HIGH (ref 70–99)
POTASSIUM: 4.2 meq/L (ref 3.5–5.1)
Sodium: 140 mEq/L (ref 135–145)

## 2017-03-27 LAB — CBC WITH DIFFERENTIAL/PLATELET
Basophils Absolute: 0 10*3/uL (ref 0.0–0.1)
Basophils Relative: 0.3 % (ref 0.0–3.0)
EOS PCT: 1.7 % (ref 0.0–5.0)
Eosinophils Absolute: 0.1 10*3/uL (ref 0.0–0.7)
HCT: 44.4 % (ref 36.0–46.0)
HEMOGLOBIN: 15 g/dL (ref 12.0–15.0)
Lymphocytes Relative: 30.8 % (ref 12.0–46.0)
Lymphs Abs: 1.8 10*3/uL (ref 0.7–4.0)
MCHC: 33.7 g/dL (ref 30.0–36.0)
MCV: 94.5 fl (ref 78.0–100.0)
MONOS PCT: 6.3 % (ref 3.0–12.0)
Monocytes Absolute: 0.4 10*3/uL (ref 0.1–1.0)
Neutro Abs: 3.5 10*3/uL (ref 1.4–7.7)
Neutrophils Relative %: 60.9 % (ref 43.0–77.0)
Platelets: 291 10*3/uL (ref 150.0–400.0)
RBC: 4.7 Mil/uL (ref 3.87–5.11)
RDW: 12.4 % (ref 11.5–15.5)
WBC: 5.8 10*3/uL (ref 4.0–10.5)

## 2017-03-27 LAB — HEPATIC FUNCTION PANEL
ALT: 27 U/L (ref 0–35)
AST: 20 U/L (ref 0–37)
Albumin: 3.5 g/dL (ref 3.5–5.2)
Alkaline Phosphatase: 67 U/L (ref 39–117)
BILIRUBIN DIRECT: 0.1 mg/dL (ref 0.0–0.3)
BILIRUBIN TOTAL: 0.5 mg/dL (ref 0.2–1.2)
Total Protein: 5.6 g/dL — ABNORMAL LOW (ref 6.0–8.3)

## 2017-03-27 LAB — TSH: TSH: 1.63 u[IU]/mL (ref 0.35–4.50)

## 2017-03-27 NOTE — Patient Instructions (Signed)
Schedule your complete physical in 6 months We'll notify you of your lab results and make any changes if needed Continue to work on healthy diet and regular exercise- you can do it! If your wrist becomes painful or the cyst enlarges- let me know and we'll place a referral to a hand specialist Call with any questions or concerns Happy Thanksgiving!

## 2017-03-27 NOTE — Assessment & Plan Note (Signed)
Chronic problem.  Adequate control.  Asymptomatic.  Check labs.  No anticipated med changes at this time. 

## 2017-03-27 NOTE — Assessment & Plan Note (Signed)
Chronic problem.  Tolerating statin w/o difficulty.  Stressed need for healthy diet and regular exercise.  Check labs.  Adjust meds prn  

## 2017-03-27 NOTE — Progress Notes (Signed)
   Subjective:    Patient ID: Bianca Hogan, female    DOB: 1955-10-14, 61 y.o.   MRN: [id number]  HPI HTN- chronic problem, on Losartan 50mg  daily w/ adequate control.  Exercising 6 days/week- yoga, body pump, walking.  No CP, SOB, HAs, visual changes, edema.  Hyperlipidemia- chronic problem, on Lipitor 20mg  daily.  Pt has gained 6 lbs since last visit.  No abd pain, N/V, myalgias.  R wrist lump- not currently painful, first noticed in September.   Review of Systems For ROS see HPI     Objective:   Physical Exam  Constitutional: She is oriented to person, place, and time. She appears well-developed and well-nourished. No distress.  HENT:  Head: Normocephalic and atraumatic.  Eyes: Conjunctivae and EOM are normal. Pupils are equal, round, and reactive to light.  Neck: Normal range of motion. Neck supple. No thyromegaly present.  Cardiovascular: Normal rate, regular rhythm, normal heart sounds and intact distal pulses.  No murmur heard. Pulmonary/Chest: Effort normal and breath sounds normal. No respiratory distress.  Abdominal: Soft. She exhibits no distension. There is no tenderness.  Musculoskeletal: She exhibits deformity (small, freely mobile soft tissue mass on dorsum of R wrist consistent w/ ganglion). She exhibits no edema or tenderness.  Lymphadenopathy:    She has no cervical adenopathy.  Neurological: She is alert and oriented to person, place, and time.  Skin: Skin is warm and dry.  Psychiatric: She has a normal mood and affect. Her behavior is normal.  Vitals reviewed.         Assessment & Plan:

## 2017-03-27 NOTE — Assessment & Plan Note (Signed)
New.  Currently asymptomatic.  Very small.  Pt is not interested in a referral at this time but told pt that if cyst enlarges or becomes painful to let me know so we can place a referral.  Pt expressed understanding and is in agreement w/ plan.

## 2017-04-11 ENCOUNTER — Ambulatory Visit
Admission: RE | Admit: 2017-04-11 | Discharge: 2017-04-11 | Disposition: A | Discharge status: Home / Self Care | Payer: BC Managed Care – PPO | Source: Ambulatory Visit | Attending: Family Medicine | Admitting: Family Medicine

## 2017-04-11 DIAGNOSIS — Z1231 Encounter for screening mammogram for malignant neoplasm of breast: Secondary | ICD-10-CM

## 2017-04-28 ENCOUNTER — Other Ambulatory Visit: Payer: Self-pay | Admitting: Family Medicine

## 2017-05-10 ENCOUNTER — Encounter: Payer: Self-pay | Admitting: Family Medicine

## 2017-05-10 ENCOUNTER — Ambulatory Visit (INDEPENDENT_AMBULATORY_CARE_PROVIDER_SITE_OTHER): Payer: BC Managed Care – PPO | Admitting: Family Medicine

## 2017-05-10 VITALS — BP 122/86 | HR 73 | Temp 98.6°F | Resp 18 | Wt 199.2 lb

## 2017-05-10 DIAGNOSIS — J069 Acute upper respiratory infection, unspecified: Secondary | ICD-10-CM

## 2017-05-10 NOTE — Progress Notes (Signed)
Subjective   CC:  Chief Complaint  Patient presents with  . Nasal Congestion    a few days ago.   . Cough    HPI: Bianca Hogan is a 61 y.o. female who presents to the office today to address the problems listed above in the chief complaint.  Patient complains of typical URI symptoms including nasal congestion, mild sore throat, cough, and mild malaise.  The symptoms have been present for several days.Shedenies high fever or productive cough shortness of breath or significant GI symptoms.  Over-the-counter cold medicines have been helpful. Traveling to [place] to see grandkids and doesn't want to get them kids.   I reviewed the patients updated PMH, FH, and SocHx.    Patient Active Problem List   Diagnosis Date Noted  . Ganglion cyst of dorsum of right wrist 03/27/2017  . Vitamin D deficiency 09/28/2016  . Toenail fungus 03/29/2016  . Maxillary sinusitis 07/24/2015  . Fungal dermatitis 03/25/2015  . Urinary incontinence, mixed 03/25/2015  . Left hip pain 03/25/2015  . Abdominal bloating 06/05/2014  . SOB (shortness of breath) 12/23/2013  . Postmenopausal HRT (hormone replacement therapy) 07/24/2013  . Routine general medical examination at a health care facility 09/19/2012  . COLONIC POLYPS, ADENOMATOUS, HX OF 06/29/2010  . PALPITATIONS, HX OF 06/12/2009  . HALLUX RIGIDUS, ACQUIRED 06/03/2008  . PREDIABETES 06/02/2008  . Hyperlipidemia 03/28/2008  . Essential hypertension 03/28/2008  . GERD 03/28/2008  . OSTEOARTHRITIS 03/28/2008  . PULMONARY NODULE 10/03/2005   Current Meds  Medication Sig  . Ascorbic Acid (VITAMIN C PO) Take 1 capsule by mouth daily.  Marland Kitchen aspirin 81 MG tablet Take 81 mg by mouth daily.  Marland Kitchen atorvastatin (LIPITOR) 20 MG tablet TAKE ONE TABLET BY MOUTH DAILY  . Cholecalciferol (VITAMIN D) 2000 units tablet Take 2,000 Units by mouth daily.  . clotrimazole-betamethasone (LOTRISONE) cream APPLY EXTERNALLY TO THE AFFECTED AREA TWICE DAILY  .  Cyanocobalamin (VITAMIN B-12 PO) Take 1 tablet by mouth daily.    Marland Kitchen estradiol (ESTRACE) 0.5 MG tablet TAKE 1 TABLET(0.5 MG) BY MOUTH DAILY  . ferrous sulfate 325 (65 FE) MG tablet Take 325 mg by mouth daily with breakfast.    . fluticasone (FLONASE) 50 MCG/ACT nasal spray SHAKE LIQUID AND USE 2 SPRAYS IN EACH NOSTRIL DAILY  . Glucosamine-Chondroit-Vit C-Mn CAPS Take 2 capsules by mouth daily. Takes two  daily   . KRILL OIL PO Take 321 mg by mouth.  . losartan (COZAAR) 50 MG tablet TAKE 1 TABLET BY MOUTH DAILY  . Lysine HCl (L-FORMULA LYSINE HCL) 500 MG TABS Take by mouth.  . Multiple Vitamins-Minerals (MULTIVITAMIN WITH MINERALS) tablet Take 1 tablet by mouth daily.    . pantoprazole (PROTONIX) 40 MG tablet TAKE 1 TABLET BY MOUTH DAILY    Review of Systems: Constitutional: Negative for fever malaise or anorexia Cardiovascular: negative for chest pain Respiratory: negative for SOB or pleuritic chest pain Gastrointestinal: negative for abdominal pain  Objective  Vitals: BP 122/86 (BP Location: Left Arm, Patient Position: Sitting, Cuff Size: Large)   Pulse 73   Temp 98.6 F (37 C) (Oral)   Resp 18   Wt 199 lb 3.2 oz (90.4 kg)   SpO2 98%   BMI 33.15 kg/m  General: no acute respiratory distress  Psych:  Alert and oriented, normal mood and affect HEENT: Normocephalic, nasal congestion present, TMs w/o erythema, OP clear w/o exudate, no cervical LAD, supple neck  Cardiovascular:  RRR without murmur or gallop. no peripheral edema  Respiratory:  Good breath sounds bilaterally, CTAB with normal respiratory effort Skin:  Warm, no rashes Neurologic:   Mental status is normal. normal gait  Assessment  1. Viral URI      Plan   Supportive care with OTC medications and time.   Follow up: Return if symptoms worsen or fail to improve.    Commons side effects, risks, benefits, and alternatives for medications and treatment plan prescribed today were discussed, and the patient expressed  understanding of the given instructions. Patient is instructed to call or message via MyChart if he/she has any questions or concerns regarding our treatment plan. No barriers to understanding were identified. We discussed Red Flag symptoms and signs in detail. Patient expressed understanding regarding what to do in case of urgent or emergency type symptoms.   Medication list was reconciled, printed and provided to the patient in AVS. Patient instructions and summary information was reviewed with the patient as documented in the AVS. This note was prepared with assistance of Dragon voice recognition software. Occasional wrong-word or sound-a-like substitutions may have occurred due to the inherent limitations of voice recognition software  No orders of the defined types were placed in this encounter.  No orders of the defined types were placed in this encounter.

## 2017-05-10 NOTE — Patient Instructions (Signed)
Mucinex DM works well twice a day.   Upper Respiratory Infection, Adult Most upper respiratory infections (URIs) are a viral infection of the air passages leading to the lungs. A URI affects the nose, throat, and upper air passages. The most common type of URI is nasopharyngitis and is typically referred to as "the common cold." URIs run their course and usually go away on their own. Most of the time, a URI does not require medical attention, but sometimes a bacterial infection in the upper airways can follow a viral infection. This is called a secondary infection. Sinus and middle ear infections are common types of secondary upper respiratory infections. Bacterial pneumonia can also complicate a URI. A URI can worsen asthma and chronic obstructive pulmonary disease (COPD). Sometimes, these complications can require emergency medical care and may be life threatening. What are the causes? Almost all URIs are caused by viruses. A virus is a type of germ and can spread from one person to another. What increases the risk? You may be at risk for a URI if:  You smoke.  You have chronic heart or lung disease.  You have a weakened defense (immune) system.  You are very young or very old.  You have nasal allergies or asthma.  You work in crowded or poorly ventilated areas.  You work in health care facilities or schools.  What are the signs or symptoms? Symptoms typically develop 2-3 days after you come in contact with a cold virus. Most viral URIs last 7-10 days. However, viral URIs from the influenza virus (flu virus) can last 14-18 days and are typically more severe. Symptoms may include:  Runny or stuffy (congested) nose.  Sneezing.  Cough.  Sore throat.  Headache.  Fatigue.  Fever.  Loss of appetite.  Pain in your forehead, behind your eyes, and over your cheekbones (sinus pain).  Muscle aches.  How is this diagnosed? Your health care provider may diagnose a URI  by:  Physical exam.  Tests to check that your symptoms are not due to another condition such as: ? Strep throat. ? Sinusitis. ? Pneumonia. ? Asthma.  How is this treated? A URI goes away on its own with time. It cannot be cured with medicines, but medicines may be prescribed or recommended to relieve symptoms. Medicines may help:  Reduce your fever.  Reduce your cough.  Relieve nasal congestion.  Follow these instructions at home:  Take medicines only as directed by your health care provider.  Gargle warm saltwater or take cough drops to comfort your throat as directed by your health care provider.  Use a warm mist humidifier or inhale steam from a shower to increase air moisture. This may make it easier to breathe.  Drink enough fluid to keep your urine clear or pale yellow.  Eat soups and other clear broths and maintain good nutrition.  Rest as needed.  Return to work when your temperature has returned to normal or as your health care provider advises. You may need to stay home longer to avoid infecting others. You can also use a face mask and careful hand washing to prevent spread of the virus.  Increase the usage of your inhaler if you have asthma.  Do not use any tobacco products, including cigarettes, chewing tobacco, or electronic cigarettes. If you need help quitting, ask your health care provider. How is this prevented? The best way to protect yourself from getting a cold is to practice good hygiene.  Avoid oral or hand  contact with people with cold symptoms.  Wash your hands often if contact occurs.  There is no clear evidence that vitamin C, vitamin E, echinacea, or exercise reduces the chance of developing a cold. However, it is always recommended to get plenty of rest, exercise, and practice good nutrition. Contact a health care provider if:  You are getting worse rather than better.  Your symptoms are not controlled by medicine.  You have  chills.  You have worsening shortness of breath.  You have brown or red mucus.  You have yellow or brown nasal discharge.  You have pain in your face, especially when you bend forward.  You have a fever.  You have swollen neck glands.  You have pain while swallowing.  You have white areas in the back of your throat. Get help right away if:  You have severe or persistent: ? Headache. ? Ear pain. ? Sinus pain. ? Chest pain.  You have chronic lung disease and any of the following: ? Wheezing. ? Prolonged cough. ? Coughing up blood. ? A change in your usual mucus.  You have a stiff neck.  You have changes in your: ? Vision. ? Hearing. ? Thinking. ? Mood. This information is not intended to replace advice given to you by your health care provider. Make sure you discuss any questions you have with your health care provider. Document Released: 10/26/2000 Document Revised: 01/03/2016 Document Reviewed: 08/07/2013 Elsevier Interactive Patient Education  [employer].

## 2017-07-14 ENCOUNTER — Other Ambulatory Visit: Payer: Self-pay | Admitting: Family Medicine

## 2017-07-24 ENCOUNTER — Other Ambulatory Visit: Payer: Self-pay

## 2017-07-24 ENCOUNTER — Ambulatory Visit (INDEPENDENT_AMBULATORY_CARE_PROVIDER_SITE_OTHER): Payer: BC Managed Care – PPO | Admitting: Physician Assistant

## 2017-07-24 ENCOUNTER — Encounter: Payer: Self-pay | Admitting: Physician Assistant

## 2017-07-24 VITALS — BP 106/80 | HR 74 | Temp 98.3°F | Resp 16 | Ht 65.0 in | Wt 198.0 lb

## 2017-07-24 DIAGNOSIS — H66001 Acute suppurative otitis media without spontaneous rupture of ear drum, right ear: Secondary | ICD-10-CM

## 2017-07-24 DIAGNOSIS — H6983 Other specified disorders of Eustachian tube, bilateral: Secondary | ICD-10-CM | POA: Diagnosis not present

## 2017-07-24 DIAGNOSIS — H6993 Unspecified Eustachian tube disorder, bilateral: Secondary | ICD-10-CM

## 2017-07-24 MED ORDER — AMOXICILLIN 875 MG PO TABS: 875.0000 mg | ORAL_TABLET | Freq: Two times a day (BID) | ORAL | 0 refills | Status: DC

## 2017-07-24 MED ORDER — FLUTICASONE PROPIONATE 50 MCG/ACT NA SUSP: 2.0000 | Freq: Every day | NASAL | 6 refills | Status: DC

## 2017-07-24 NOTE — Patient Instructions (Signed)
Start Claritin-D daily until this improves. Then you can switch to plain Claritin.  Use the Flonase once daily as directed. Take the antibiotic (Amoxicillin) twice daily as directed with food.  If symptoms are not resolving, please let us know.  We need to consider an ENT referral.

## 2017-07-24 NOTE — Progress Notes (Signed)
Patient presents to clinic today c/o ear fullness and pressure bilaterally with R>L and intermittent ear pain. Notes some rhinorrhea and nasal congestion. Denies sinus pain, tooth pain, fever or chills. Has history of allergic rhinitis, but not typically until Spring. Symptoms worsened over the past couple of weeks after flying in an airplane. Is noting a popping sound in the ears.   Past Medical History:  Diagnosis Date  . Allergy   . Bronchitis 01/23/2011  . GERD (gastroesophageal reflux disease)   . HLD (hyperlipidemia)   . Hypertension   . Osteoarthritis   . Shortness of breath    dyspenea; echo 12/10 EF 55-60%, nml; myoview 12/10 EF 83%, nml    Current Outpatient Medications on File Prior to Visit  Medication Sig Dispense Refill  . Ascorbic Acid (VITAMIN C PO) Take 1 capsule by mouth daily.    Marland Kitchen aspirin 81 MG tablet Take 81 mg by mouth daily.    Marland Kitchen atorvastatin (LIPITOR) 20 MG tablet TAKE ONE TABLET BY MOUTH DAILY 90 tablet 1  . Cholecalciferol (VITAMIN D) 2000 units tablet Take 2,000 Units by mouth daily.    . clotrimazole-betamethasone (LOTRISONE) cream APPLY EXTERNALLY TO THE AFFECTED AREA TWICE DAILY 30 g 0  . Cyanocobalamin (VITAMIN B-12 PO) Take 1 tablet by mouth daily.      Marland Kitchen estradiol (ESTRACE) 0.5 MG tablet TAKE 1 TABLET(0.5 MG) BY MOUTH DAILY 90 tablet 0  . ferrous sulfate 325 (65 FE) MG tablet Take 325 mg by mouth daily with breakfast.      . fluticasone (FLONASE) 50 MCG/ACT nasal spray SHAKE LIQUID AND USE 2 SPRAYS IN EACH NOSTRIL DAILY 16 g 6  . Glucosamine-Chondroit-Vit C-Mn CAPS Take 2 capsules by mouth daily. Takes two  daily     . KRILL OIL PO Take 321 mg by mouth.    . losartan (COZAAR) 50 MG tablet TAKE 1 TABLET BY MOUTH DAILY 90 tablet 1  . Lysine HCl (L-FORMULA LYSINE HCL) 500 MG TABS Take by mouth.    . Multiple Vitamins-Minerals (MULTIVITAMIN WITH MINERALS) tablet Take 1 tablet by mouth daily.      . pantoprazole (PROTONIX) 40 MG tablet TAKE 1 TABLET BY  MOUTH DAILY 30 tablet 6   No current facility-administered medications on file prior to visit.     Allergies  Allergen Reactions  . Lisinopril Cough  . Naproxen Nausea Only    Family History  Problem Relation Age of Onset  . Heart disease Father   . Diabetes Mother   . Heart disease Mother   . Diabetes Sister   . Heart disease Sister   . Diabetes Sister   . Heart disease Sister   . Heart disease Maternal Grandmother   . Heart disease Maternal Grandfather   . Heart disease Paternal Grandmother   . Heart disease Paternal Grandfather   . Cancer Neg Hx   . Breast cancer Neg Hx     Social History   Socioeconomic History  . Marital status: Married    Spouse name: None  . Number of children: None  . Years of education: None  . Highest education level: None  Social Needs  . Financial resource strain: None  . Food insecurity - worry: None  . Food insecurity - inability: None  . Transportation needs - medical: None  . Transportation needs - non-medical: None  Occupational History  . None  Tobacco Use  . Smoking status: Former Smoker    Last attempt to quit: 05/25/1987  Years since quitting: 30.1  . Smokeless tobacco: Never Used  Substance and Sexual Activity  . Alcohol use: Yes    Alcohol/week: 7.2 oz    Types: 12 Glasses of wine per week    Comment: 1-2 beers daily  . Drug use: No  . Sexual activity: None  Other Topics Concern  . 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    Does not get regular exercise; diet - fruits and veggies.       Pt signed designated party release signed 12/08/09 appointing Wendelyn Breslow. May leave msg on home machine.    Review of Systems - See HPI.  All other ROS are negative.  BP 106/80   Pulse 74   Temp 98.3 F (36.8 C) (Oral)   Resp 16   Ht 5\' 5"  (1.651 m)   Wt 198 lb (89.8 kg)   SpO2 96%   BMI 32.95 kg/m   Physical Exam  Constitutional: She is oriented to person, place, and time and  well-developed, well-nourished, and in no distress.  HENT:  Head: Normocephalic and atraumatic.  Right Ear: Tympanic membrane is erythematous and bulging. A middle ear effusion (suppurative) is present.  Left Ear: A middle ear effusion (serous) is present.  Nose: Nose normal. Right sinus exhibits no maxillary sinus tenderness and no frontal sinus tenderness. Left sinus exhibits no maxillary sinus tenderness and no frontal sinus tenderness.  Mouth/Throat: Uvula is midline, oropharynx is clear and moist and mucous membranes are normal.  Eyes: Conjunctivae are normal.  Neck: Neck supple.  Cardiovascular: Normal rate, regular rhythm, normal heart sounds and intact distal pulses.  Pulmonary/Chest: Effort normal and breath sounds normal. No respiratory distress. She has no wheezes. She has no rales. She exhibits no tenderness.  Neurological: She is alert and oriented to person, place, and time.  Skin: Skin is warm and dry. No rash noted.  Psychiatric: Affect normal.  Vitals reviewed.  Recent Results (from the past 2160 hour(s))  Converted [facility]     Status: None   Collection Time: 11/14/07 12:00 AM  Result Value Ref Range   Pap Smear Normal     Assessment/Plan: 1. Eustachian tube dysfunction, bilateral Flonase and Astelin. Continue antihistamine with decongestant. If not improving, will need ENT assessment.  - fluticasone (FLONASE) 50 MCG/ACT nasal spray; Place 2 sprays into both nostrils daily.  Dispense: 16 g; Refill: 6  2. Acute suppurative otitis media of right ear without spontaneous rupture of tympanic membrane, recurrence not specified Secondary to fluid build up from #1. Start Amoxicillin. Care as discussed above.  - amoxicillin (AMOXIL) 875 MG tablet; Take 1 tablet (875 mg total) by mouth 2 (two) times daily.  Dispense: 20 tablet; Refill: 0   Leeanne Rio, PA-C

## 2017-07-26 ENCOUNTER — Other Ambulatory Visit: Payer: Self-pay | Admitting: Family Medicine

## 2017-08-03 ENCOUNTER — Ambulatory Visit: Payer: Self-pay | Admitting: *Deleted

## 2017-08-03 ENCOUNTER — Ambulatory Visit (INDEPENDENT_AMBULATORY_CARE_PROVIDER_SITE_OTHER): Payer: BC Managed Care – PPO | Admitting: Family Medicine

## 2017-08-03 ENCOUNTER — Encounter: Payer: Self-pay | Admitting: Family Medicine

## 2017-08-03 VITALS — BP 130/86 | HR 91 | Temp 98.4°F | Ht 65.0 in

## 2017-08-03 DIAGNOSIS — H6983 Other specified disorders of Eustachian tube, bilateral: Secondary | ICD-10-CM | POA: Diagnosis not present

## 2017-08-03 DIAGNOSIS — J301 Allergic rhinitis due to pollen: Secondary | ICD-10-CM | POA: Diagnosis not present

## 2017-08-03 DIAGNOSIS — J208 Acute bronchitis due to other specified organisms: Secondary | ICD-10-CM | POA: Diagnosis not present

## 2017-08-03 MED ORDER — PREDNISONE 10 MG PO TABS: ORAL_TABLET | ORAL | 0 refills | Status: DC

## 2017-08-03 MED ORDER — GUAIFENESIN-CODEINE 100-10 MG/5ML PO SOLN: 5.0000 mL | Freq: Four times a day (QID) | ORAL | 0 refills | Status: DC | PRN

## 2017-08-03 NOTE — Telephone Encounter (Signed)
Pt called due to cough, and wheezing which started an 07/31/17 and shortness of breath that started today; nurse triage initiated and recommendations made per protocol to include going to PCP today; pt requests to see DrTabori, pt offered and accepted an appointment with Dr Birdie Riddle 08/03/17 at 1300; pt verbalizes understanding; will route to office for notification of this upcoming appointment.  Reason for Disposition . Patient sounds very sick or weak to the triager  Answer Assessment - Initial Assessment Questions 1. RESPIRATORY STATUS: "Describe your breathing?" (e.g., wheezing, shortness of breath, unable to speak, severe coughing)      Wheezing starting on 08/01/17, shortness of breath especially with exertion today  2. ONSET: "When did this breathing problem begin?"      Monday  3. PATTERN "Does the difficult breathing come and go, or has it been constant since it started?"      constant 4. SEVERITY: "How bad is your breathing?" (e.g., mild, moderate, severe)    - MILD: No SOB at rest, mild SOB with walking, speaks normally in sentences, can lay down, no retractions, pulse < 100.    - MODERATE: SOB at rest, SOB with minimal exertion and prefers to sit, cannot lie down flat, speaks in phrases, mild retractions, audible wheezing, pulse 100-120.    - SEVERE: Very SOB at rest, speaks in single words, struggling to breathe, sitting hunched forward, retractions, pulse > 120      moderate 5. RECURRENT SYMPTOM: "Have you had difficulty breathing before?" If so, ask: "When was the last time?" and "What happened that time?"      no 6. CARDIAC HISTORY: "Do you have any history of heart disease?" (e.g., heart attack, angina, bypass surgery, angioplasty)      hypertension 7. LUNG HISTORY: "Do you have any history of lung disease?"  (e.g., pulmonary embolus, asthma, emphysema)     no 8. CAUSE: "What do you think is causing the breathing problem?"      unsure 9. OTHER SYMPTOMS: "Do you have any other  symptoms? (e.g., dizziness, runny nose, cough, chest pain, fever)     Cough, nasal congestion with light green secretions, sinus pain/pressure, laryngitis 10. PREGNANCY: "Is there any chance you are pregnant?" "When was your last menstrual period?"       No hysterectomy  11. TRAVEL: "Have you traveled out of the country in the last month?" (e.g., travel history, exposures)       no  Protocols used: BREATHING DIFFICULTY-A-AH

## 2017-08-03 NOTE — Progress Notes (Signed)
Subjective  CC:  Chief Complaint  Patient presents with  . Cough    x 4 days, finished Abx last week., shortness of breath     HPI: SUBJECTIVE:  Bianca Hogan is a 62 y.o. female who complains of congestion, nasal blockage, post nasal drip, cough described as dry and denies sinus, high fevers, SOB, chest pain or significant GI symptoms. Symptoms have been present for 3-4 days. She just completed a course of amoxicillin for a serous otiitis vs AOM. She never had pain of fever but had decreased hearing on left. This all got better after starting claritin D. She denies a history of anorexia, dizziness, vomiting and wheezing. She denies a history of asthma or COPD. Patient does not smoke cigarettes.  I reviewed the patients updated PMH, FH, and SocHx.  Social History: Patient  reports that she quit smoking about 30 years ago. She has never used smokeless tobacco. She reports that she drinks about 7.2 oz of alcohol per week. She reports that she does not use drugs.  Patient Active Problem List   Diagnosis Date Noted  . Ganglion cyst of dorsum of right wrist 03/27/2017  . Vitamin D deficiency 09/28/2016  . Toenail fungus 03/29/2016  . Maxillary sinusitis 07/24/2015  . Fungal dermatitis 03/25/2015  . Urinary incontinence, mixed 03/25/2015  . Left hip pain 03/25/2015  . Abdominal bloating 06/05/2014  . SOB (shortness of breath) 12/23/2013  . Postmenopausal HRT (hormone replacement therapy) 07/24/2013  . Routine general medical examination at a health care facility 09/19/2012  . COLONIC POLYPS, ADENOMATOUS, HX OF 06/29/2010  . PALPITATIONS, HX OF 06/12/2009  . HALLUX RIGIDUS, ACQUIRED 06/03/2008  . PREDIABETES 06/02/2008  . Hyperlipidemia 03/28/2008  . Essential hypertension 03/28/2008  . GERD 03/28/2008  . OSTEOARTHRITIS 03/28/2008  . PULMONARY NODULE 10/03/2005    Review of Systems: Cardiovascular: negative for chest pain Respiratory: negative for SOB or  hemoptysis Gastrointestinal: negative for abdominal pain Genitourinary: negative for dysuria or gross hematuria Current Meds  Medication Sig  . Ascorbic Acid (VITAMIN C PO) Take 1 capsule by mouth daily.  Marland Kitchen aspirin 81 MG tablet Take 81 mg by mouth daily.  Marland Kitchen atorvastatin (LIPITOR) 20 MG tablet TAKE 1 TABLET BY MOUTH DAILY  . Cholecalciferol (VITAMIN D) 2000 units tablet Take 2,000 Units by mouth daily.  . clotrimazole-betamethasone (LOTRISONE) cream APPLY EXTERNALLY TO THE AFFECTED AREA TWICE DAILY  . Cyanocobalamin (VITAMIN B-12 PO) Take 1 tablet by mouth daily.    Marland Kitchen estradiol (ESTRACE) 0.5 MG tablet TAKE 1 TABLET(0.5 MG) BY MOUTH DAILY  . ferrous sulfate 325 (65 FE) MG tablet Take 325 mg by mouth daily with breakfast.    . fluticasone (FLONASE) 50 MCG/ACT nasal spray Place 2 sprays into both nostrils daily.  . Glucosamine-Chondroit-Vit C-Mn CAPS Take 2 capsules by mouth daily. Takes two  daily   . KRILL OIL PO Take 321 mg by mouth.  . losartan (COZAAR) 50 MG tablet TAKE 1 TABLET BY MOUTH DAILY  . Lysine HCl (L-FORMULA LYSINE HCL) 500 MG TABS Take by mouth.  . Multiple Vitamins-Minerals (MULTIVITAMIN WITH MINERALS) tablet Take 1 tablet by mouth daily.    . pantoprazole (PROTONIX) 40 MG tablet TAKE 1 TABLET BY MOUTH DAILY  . [DISCONTINUED] amoxicillin (AMOXIL) 875 MG tablet Take 1 tablet (875 mg total) by mouth 2 (two) times daily.    Objective  Vitals: BP 130/86   Pulse 91   Temp 98.4 F (36.9 C)   Ht 5\' 5"  (1.651 m)  SpO2 97%   BMI 32.95 kg/m  General: no acute distress  Psych:  Alert and oriented, normal mood and affect HEENT:  Normocephalic, atraumatic, supple neck, moist mucous membranes, mildly erythematous pharynx without exudate, mild lymphadenopathy, supple neck, bilateral serous effusions w/o erythema. Nl landmarks Cardiovascular:  RRR without murmur. no edema Respiratory:  Good breath sounds bilaterally, CTAB with normal respiratory effort with occasional rhonchi Skin:   Warm, no rashes Neurologic:   Mental status is normal. normal gait  Assessment  1. Viral bronchitis   2. Eustachian tube dysfunction, bilateral   3. Seasonal allergic rhinitis due to pollen      Plan  Discussion:  Recurrent URI - likely viral. pred tper and cough meds for supportive care. Education regarding differences between viral and bacterial infections and treatment options are discussed.  Supportive care measures are recommended.  We discussed the use of mucolytic's, decongestants, antihistamines and antitussives as needed.  Tylenol or Advil are recommended if needed.  Follow up: prn   Commons side effects, risks, benefits, and alternatives for medications and treatment plan prescribed today were discussed, and the patient expressed understanding of the given instructions. Patient is instructed to call or message via MyChart if he/she has any questions or concerns regarding our treatment plan. No barriers to understanding were identified. We discussed Red Flag symptoms and signs in detail. Patient expressed understanding regarding what to do in case of urgent or emergency type symptoms.  Medication list was reconciled, printed and provided to the patient in AVS. Patient instructions and summary information was reviewed with the patient as documented in the AVS. This note was prepared with assistance of Dragon voice recognition software. Occasional wrong-word or sound-a-like substitutions may have occurred due to the inherent limitations of voice recognition software  No orders of the defined types were placed in this encounter.  Meds ordered this encounter  Medications  . predniSONE (DELTASONE) 10 MG tablet    Sig: Take 4 tabs qd x 2 days, 3 qd x 2 days, 2 qd x 2d, 1qd x 3 days    Dispense:  21 tablet    Refill:  0  . guaiFENesin-codeine 100-10 MG/5ML syrup    Sig: Take 5 mLs by mouth every 6 (six) hours as needed for cough.    Dispense:  120 mL    Refill:  0

## 2017-08-03 NOTE — Patient Instructions (Signed)
Please follow up if symptoms do not improve or as needed.    Acute Bronchitis, Adult Acute bronchitis is sudden (acute) swelling of the air tubes (bronchi) in the lungs. Acute bronchitis causes these tubes to fill with mucus, which can make it hard to breathe. It can also cause coughing or wheezing. In adults, acute bronchitis usually goes away within 2 weeks. A cough caused by bronchitis may last up to 3 weeks. Smoking, allergies, and asthma can make the condition worse. Repeated episodes of bronchitis may cause further lung problems, such as chronic obstructive pulmonary disease (COPD). What are the causes? This condition can be caused by germs and by substances that irritate the lungs, including:  Cold and flu viruses. This condition is most often caused by the same virus that causes a cold.  Bacteria.  Exposure to tobacco smoke, dust, fumes, and air pollution.  What increases the risk? This condition is more likely to develop in people who:  Have close contact with someone with acute bronchitis.  Are exposed to lung irritants, such as tobacco smoke, dust, fumes, and vapors.  Have a weak immune system.  Have a respiratory condition such as asthma.  What are the signs or symptoms? Symptoms of this condition include:  A cough.  Coughing up clear, yellow, or green mucus.  Wheezing.  Chest congestion.  Shortness of breath.  A fever.  Body aches.  Chills.  A sore throat.  How is this diagnosed? This condition is usually diagnosed with a physical exam. During the exam, your health care provider may order tests, such as chest X-rays, to rule out other conditions. He or she may also:  Test a sample of your mucus for bacterial infection.  Check the level of oxygen in your blood. This is done to check for pneumonia.  Do a chest X-ray or lung function testing to rule out pneumonia and other conditions.  Perform blood tests.  Your health care provider will also ask  about your symptoms and medical history. How is this treated? Most cases of acute bronchitis clear up over time without treatment. Your health care provider may recommend:  Drinking more fluids. Drinking more makes your mucus thinner, which may make it easier to breathe.  Taking a medicine for a fever or cough.  Taking an antibiotic medicine.  Using an inhaler to help improve shortness of breath and to control a cough.  Using a cool mist vaporizer or humidifier to make it easier to breathe.  Follow these instructions at home: Medicines  Take over-the-counter and prescription medicines only as told by your health care provider.  If you were prescribed an antibiotic, take it as told by your health care provider. Do not stop taking the antibiotic even if you start to feel better. General instructions  Get plenty of rest.  Drink enough fluids to keep your urine clear or pale yellow.  Avoid smoking and secondhand smoke. Exposure to cigarette smoke or irritating chemicals will make bronchitis worse. If you smoke and you need help quitting, ask your health care provider. Quitting smoking will help your lungs heal faster.  Use an inhaler, cool mist vaporizer, or humidifier as told by your health care provider.  Keep all follow-up visits as told by your health care provider. This is important. How is this prevented? To lower your risk of getting this condition again:  Wash your hands often with soap and water. If soap and water are not available, use hand sanitizer.  Avoid contact with   people who have cold symptoms.  Try not to touch your hands to your mouth, nose, or eyes.  Make sure to get the flu shot every year.  Contact a health care provider if:  Your symptoms do not improve in 2 weeks of treatment. Get help right away if:  You cough up blood.  You have chest pain.  You have severe shortness of breath.  You become dehydrated.  You faint or keep feeling like you are  going to faint.  You keep vomiting.  You have a severe headache.  Your fever or chills gets worse. This information is not intended to replace advice given to you by your health care provider. Make sure you discuss any questions you have with your health care provider. Document Released: 06/09/2004 Document Revised: 11/25/2015 Document Reviewed: 10/21/2015 Elsevier Interactive Patient Education  2018 Elsevier Inc.   

## 2017-08-24 ENCOUNTER — Ambulatory Visit (INDEPENDENT_AMBULATORY_CARE_PROVIDER_SITE_OTHER): Payer: BC Managed Care – PPO | Admitting: Family Medicine

## 2017-08-24 ENCOUNTER — Other Ambulatory Visit: Payer: Self-pay

## 2017-08-24 ENCOUNTER — Encounter: Payer: Self-pay | Admitting: Family Medicine

## 2017-08-24 VITALS — BP 118/80 | HR 85 | Temp 98.8°F | Resp 16 | Ht 65.0 in | Wt 196.0 lb

## 2017-08-24 DIAGNOSIS — H6981 Other specified disorders of Eustachian tube, right ear: Secondary | ICD-10-CM | POA: Diagnosis not present

## 2017-08-24 DIAGNOSIS — J301 Allergic rhinitis due to pollen: Secondary | ICD-10-CM | POA: Diagnosis not present

## 2017-08-24 MED ORDER — PREDNISONE 10 MG PO TABS
ORAL_TABLET | ORAL | 0 refills | Status: DC
Start: 2017-08-24 — End: 2017-09-22

## 2017-08-24 MED ORDER — MONTELUKAST SODIUM 10 MG PO TABS: 10.0000 mg | ORAL_TABLET | Freq: Every day | ORAL | 3 refills | Status: DC

## 2017-08-24 NOTE — Progress Notes (Signed)
   Subjective:    Patient ID: Bianca Hogan, female    DOB: 11/10/1955, 62 y.o.   MRN: [id number]  HPI URI- 'I feel like crap'.  Pt was seen on 3/21 and dx'd w/ viral bronchitis.  Tx'd w/ cough meds and prednisone.  She knows there is an allergy component but R ear has been clogged and now w/ sore throat.  + facial pressure.  No tooth pain.  No fevers.  Started OTC Zyrtec, then switched to Claritin D, and last night switched to Xyzal.  Using Flonase daily and sinus rinse regularly.   Review of Systems For ROS see HPI     Objective:   Physical Exam  Constitutional: She appears well-developed and well-nourished. No distress.  HENT:  Head: Normocephalic and atraumatic.  Right Ear: Tympanic membrane is retracted (very retracted and red).  Left Ear: Tympanic membrane normal.  Nose: Mucosal edema and rhinorrhea present. Right sinus exhibits no maxillary sinus tenderness and no frontal sinus tenderness. Left sinus exhibits no maxillary sinus tenderness and no frontal sinus tenderness.  Mouth/Throat: Mucous membranes are normal. Posterior oropharyngeal erythema (w/ PND) present.  Eyes: Pupils are equal, round, and reactive to light. Conjunctivae and EOM are normal.  Neck: Normal range of motion. Neck supple.  Cardiovascular: Normal rate, regular rhythm and normal heart sounds.  Pulmonary/Chest: Effort normal and breath sounds normal. No respiratory distress. She has no wheezes. She has no rales.  Lymphadenopathy:    She has no cervical adenopathy.  Vitals reviewed.         Assessment & Plan:  Rhinitis/Eustachian tube dysfxn- deteriorated.  Pt has severe retraction of R TM and copious PND.  Repeat Prednisone taper as this improved her sxs.  Add Singulair to improve allergy congestion/drainage.  Reviewed supportive care and red flags that should prompt return.  Pt expressed understanding and is in agreement w/ plan.

## 2017-08-24 NOTE — Patient Instructions (Addendum)
Follow up as needed or as scheduled Continue the Xyzal and Flonase daily START the Prednisone daily- take w/ food START the Singulair (Montelukast) nightly to help w/ allergies Drink plenty of fluids Call with any questions or concerns Hang in there!!!

## 2017-09-07 ENCOUNTER — Telehealth: Payer: Self-pay | Admitting: General Practice

## 2017-09-07 DIAGNOSIS — H9193 Unspecified hearing loss, bilateral: Secondary | ICD-10-CM

## 2017-09-07 NOTE — Telephone Encounter (Signed)
Please advise, I looked at pt last OV 08/24/17. No notation on ENT referral?  Copied from [place] [id number]. Topic: General - Other >> Sep 07, 2017  7:19 AM Yvette Rack wrote: Reason for CRM: patient states that she can't hear out of her ears and would like to be referred to a ENT she states that Dr Birdie Riddle said that if her ears aren't any better that would be the next step

## 2017-09-07 NOTE — Telephone Encounter (Signed)
Ok to place referral to ENT. 

## 2017-09-07 NOTE — Telephone Encounter (Signed)
Referral placed. Pt informed. 

## 2017-09-07 NOTE — Addendum Note (Signed)
Addended by: Davis Gourd on: 09/07/2017 08:55 AM   Modules accepted: Orders

## 2017-09-18 ENCOUNTER — Encounter: Payer: Self-pay | Admitting: Family Medicine

## 2017-09-22 ENCOUNTER — Other Ambulatory Visit: Payer: Self-pay

## 2017-09-22 ENCOUNTER — Encounter: Payer: Self-pay | Admitting: Family Medicine

## 2017-09-22 ENCOUNTER — Ambulatory Visit (INDEPENDENT_AMBULATORY_CARE_PROVIDER_SITE_OTHER): Payer: BC Managed Care – PPO | Admitting: Family Medicine

## 2017-09-22 VITALS — BP 122/82 | HR 73 | Temp 98.2°F | Resp 16 | Ht 65.0 in | Wt 198.1 lb

## 2017-09-22 DIAGNOSIS — E785 Hyperlipidemia, unspecified: Secondary | ICD-10-CM

## 2017-09-22 DIAGNOSIS — I10 Essential (primary) hypertension: Secondary | ICD-10-CM

## 2017-09-22 DIAGNOSIS — H66002 Acute suppurative otitis media without spontaneous rupture of ear drum, left ear: Secondary | ICD-10-CM | POA: Diagnosis not present

## 2017-09-22 LAB — CBC WITH DIFFERENTIAL/PLATELET
BASOS ABS: 0 10*3/uL (ref 0.0–0.1)
Basophils Relative: 0.3 % (ref 0.0–3.0)
EOS ABS: 0.2 10*3/uL (ref 0.0–0.7)
Eosinophils Relative: 3.4 % (ref 0.0–5.0)
HCT: 42.4 % (ref 36.0–46.0)
Hemoglobin: 14.6 g/dL (ref 12.0–15.0)
LYMPHS PCT: 22.9 % (ref 12.0–46.0)
Lymphs Abs: 1.3 10*3/uL (ref 0.7–4.0)
MCHC: 34.4 g/dL (ref 30.0–36.0)
MCV: 92.7 fl (ref 78.0–100.0)
MONOS PCT: 8 % (ref 3.0–12.0)
Monocytes Absolute: 0.5 10*3/uL (ref 0.1–1.0)
NEUTROS ABS: 3.8 10*3/uL (ref 1.4–7.7)
Neutrophils Relative %: 65.4 % (ref 43.0–77.0)
PLATELETS: 325 10*3/uL (ref 150.0–400.0)
RBC: 4.58 Mil/uL (ref 3.87–5.11)
RDW: 12.5 % (ref 11.5–15.5)
WBC: 5.8 10*3/uL (ref 4.0–10.5)

## 2017-09-22 LAB — BASIC METABOLIC PANEL
BUN: 14 mg/dL (ref 6–23)
CHLORIDE: 105 meq/L (ref 96–112)
CO2: 27 mEq/L (ref 19–32)
CREATININE: 0.58 mg/dL (ref 0.40–1.20)
Calcium: 8.7 mg/dL (ref 8.4–10.5)
GFR: 111.88 mL/min (ref >60.00)
Glucose, Bld: 98 mg/dL (ref 70–99)
POTASSIUM: 4 meq/L (ref 3.5–5.1)
Sodium: 140 mEq/L (ref 135–145)

## 2017-09-22 LAB — LIPID PANEL
CHOL/HDL RATIO: 3
Cholesterol: 194 mg/dL (ref 0–200)
HDL: 69.2 mg/dL (ref >39.00)
LDL Cholesterol: 103 mg/dL — ABNORMAL HIGH (ref 0–99)
NONHDL: 124.68
Triglycerides: 106 mg/dL (ref 0.0–149.0)
VLDL: 21.2 mg/dL (ref 0.0–40.0)

## 2017-09-22 LAB — HEPATIC FUNCTION PANEL
ALBUMIN: 3.5 g/dL (ref 3.5–5.2)
ALT: 20 U/L (ref 0–35)
AST: 18 U/L (ref 0–37)
Alkaline Phosphatase: 79 U/L (ref 39–117)
BILIRUBIN DIRECT: 0.1 mg/dL (ref 0.0–0.3)
TOTAL PROTEIN: 6.1 g/dL (ref 6.0–8.3)
Total Bilirubin: 0.4 mg/dL (ref 0.2–1.2)

## 2017-09-22 LAB — TSH: TSH: 1.51 u[IU]/mL (ref 0.35–4.50)

## 2017-09-22 MED ORDER — AMOXICILLIN 875 MG PO TABS: 875.0000 mg | ORAL_TABLET | Freq: Two times a day (BID) | ORAL | 0 refills | Status: DC

## 2017-09-22 NOTE — Progress Notes (Signed)
   Subjective:    Patient ID: Bianca Hogan, female    DOB: [birth year], 62 y.o.   MRN: [id number]  HPI HTN- chronic problem, on Losartan 50mg  daily.  Well controlled today.  No CP, SOB, HAs, visual changes, edema.  Hyperlipidemia- chronic problem, on Lipitor 20mg  daily.  Walking 5x/week.  No abd pain,  Decreased hearing- continues to have decreased hearing bilaterally.  Will have intermittent pains.  No drainage.   Review of Systems For ROS see HPI     Objective:   Physical Exam  Constitutional: She is oriented to person, place, and time. She appears well-developed and well-nourished. No distress.  HENT:  Head: Normocephalic and atraumatic.  Right Ear: Tympanic membrane is retracted.  Left Ear: Tympanic membrane is bulging. A middle ear effusion is present.  Eyes: Pupils are equal, round, and reactive to light. Conjunctivae and EOM are normal.  Neck: Normal range of motion. Neck supple. No thyromegaly present.  Cardiovascular: Normal rate, regular rhythm, normal heart sounds and intact distal pulses.  No murmur heard. Pulmonary/Chest: Effort normal and breath sounds normal. No respiratory distress.  Abdominal: Soft. She exhibits no distension. There is no tenderness.  Musculoskeletal: She exhibits no edema.  Lymphadenopathy:    She has no cervical adenopathy.  Neurological: She is alert and oriented to person, place, and time.  Skin: Skin is warm and dry.  Psychiatric: She has a normal mood and affect. Her behavior is normal.  Vitals reviewed.         Assessment & Plan:  L OM- new.  Pt's sxs and PE consistent w/ infxn.  Given duration of sxs (>1 month) will start abx in hopes to clear infxn prior to upcoming ENT appt.  Reviewed supportive care and red flags that should prompt return.  Pt expressed understanding and is in agreement w/ plan.

## 2017-09-22 NOTE — Patient Instructions (Signed)
Schedule your complete physical in 3-4 months We'll notify you of your lab results and make any changes if needed Continue to work on healthy diet and regular exercise- you can do it!!! START the Amoxicillin twice daily for the ear infection- take w/ food Call with any questions or concerns Happy Mother's Day!!!

## 2017-09-22 NOTE — Assessment & Plan Note (Signed)
Chronic problem.  Tolerating statin w/o difficulty.  Check labs.  Adjust meds prn  

## 2017-09-22 NOTE — Assessment & Plan Note (Signed)
Chronic problem.  Well controlled today.  Asymptomatic.  Stressed need for healthy diet and regular exercise.  Check labs.  No anticipated med changes.  Will follow. 

## 2017-09-24 ENCOUNTER — Other Ambulatory Visit: Payer: Self-pay | Admitting: Family Medicine

## 2017-09-25 ENCOUNTER — Encounter: Payer: Self-pay | Admitting: General Practice

## 2017-10-02 DIAGNOSIS — H6983 Other specified disorders of Eustachian tube, bilateral: Secondary | ICD-10-CM | POA: Insufficient documentation

## 2017-10-28 ENCOUNTER — Other Ambulatory Visit: Payer: Self-pay | Admitting: Family Medicine

## 2017-11-05 ENCOUNTER — Other Ambulatory Visit: Payer: Self-pay | Admitting: Family Medicine

## 2017-11-19 ENCOUNTER — Other Ambulatory Visit: Payer: Self-pay | Admitting: Family Medicine

## 2018-01-26 ENCOUNTER — Other Ambulatory Visit: Payer: Self-pay | Admitting: Family Medicine

## 2018-01-26 NOTE — Telephone Encounter (Signed)
Received and reviewed medication refill request.  Request is appropriate and was approved.  Please see medication orders for details.  

## 2018-02-09 ENCOUNTER — Encounter: Payer: BC Managed Care – PPO | Admitting: Family Medicine

## 2018-03-04 ENCOUNTER — Other Ambulatory Visit: Payer: Self-pay | Admitting: Family Medicine

## 2018-03-05 ENCOUNTER — Other Ambulatory Visit: Payer: Self-pay | Admitting: Family Medicine

## 2018-04-17 ENCOUNTER — Other Ambulatory Visit: Payer: Self-pay | Admitting: Family Medicine

## 2018-04-21 ENCOUNTER — Other Ambulatory Visit: Payer: Self-pay | Admitting: Family Medicine

## 2018-04-23 ENCOUNTER — Other Ambulatory Visit: Payer: Self-pay | Admitting: Family Medicine

## 2018-04-23 DIAGNOSIS — Z1231 Encounter for screening mammogram for malignant neoplasm of breast: Secondary | ICD-10-CM

## 2018-04-24 ENCOUNTER — Ambulatory Visit
Admission: RE | Admit: 2018-04-24 | Discharge: 2018-04-24 | Disposition: A | Discharge status: Home / Self Care | Payer: BC Managed Care – PPO | Source: Ambulatory Visit

## 2018-04-24 DIAGNOSIS — Z1231 Encounter for screening mammogram for malignant neoplasm of breast: Secondary | ICD-10-CM

## 2018-06-04 ENCOUNTER — Telehealth: Payer: Self-pay | Admitting: General Practice

## 2018-06-04 DIAGNOSIS — E559 Vitamin D deficiency, unspecified: Secondary | ICD-10-CM

## 2018-06-04 DIAGNOSIS — E785 Hyperlipidemia, unspecified: Secondary | ICD-10-CM

## 2018-06-04 NOTE — Telephone Encounter (Signed)
Please advise if ok to have CPE labs drawn at [place] and Dx?   Copied from [place]. Topic: General - Other >> Jun 04, 2018 12:19 PM Burchel, Abbi R wrote: Pt requesting to have CPE labs drawn at [facility].  Please advise.   [phone]

## 2018-06-07 ENCOUNTER — Ambulatory Visit (INDEPENDENT_AMBULATORY_CARE_PROVIDER_SITE_OTHER): Payer: BC Managed Care – PPO | Admitting: Family Medicine

## 2018-06-07 ENCOUNTER — Encounter: Payer: Self-pay | Admitting: Family Medicine

## 2018-06-07 ENCOUNTER — Other Ambulatory Visit: Payer: Self-pay

## 2018-06-07 ENCOUNTER — Encounter

## 2018-06-07 VITALS — BP 130/82 | HR 68 | Temp 98.0°F | Resp 14 | Ht 65.0 in | Wt 178.0 lb

## 2018-06-07 DIAGNOSIS — Z0000 Encounter for general adult medical examination without abnormal findings: Secondary | ICD-10-CM | POA: Diagnosis not present

## 2018-06-07 DIAGNOSIS — Z23 Encounter for immunization: Secondary | ICD-10-CM | POA: Diagnosis not present

## 2018-06-07 MED ORDER — FLUCONAZOLE 150 MG PO TABS: 150.0000 mg | ORAL_TABLET | Freq: Once | ORAL | 0 refills | Status: AC

## 2018-06-07 NOTE — Telephone Encounter (Signed)
Labs ordered and called pt and LMOVM to inform that she could call and get labs drawn at office of choice this morning and we would see her for her CPE this afternoon.   [place] for [facility] to Discuss results / PCP recommendations / Schedule patient.

## 2018-06-07 NOTE — Assessment & Plan Note (Signed)
Pt's PE WNL.  Applauded her efforts at weight loss.  UTD on mammo, colonoscopy, immunizations.  1st Shingrix was given today.  Check labs.  Anticipatory guidance provided.

## 2018-06-07 NOTE — Telephone Encounter (Signed)
[place] for lab orders: Vit D deficiency- Vit D Hyperlipidemia- CBC w/ diff, BMP, LFTs, Lipid Panel, TSH

## 2018-06-07 NOTE — Addendum Note (Signed)
Addended by: Davis Gourd on: 06/07/2018 04:29 PM   Modules accepted: Orders

## 2018-06-07 NOTE — Patient Instructions (Addendum)
Follow up in 6 months to recheck BP and cholesterol (and get 2nd Shingrix) We'll notify you of your lab results and make any changes if needed (once you get them done) Keep up the good work on healthy diet and regular exercise- you look great!!! DECREASE the Losartan to 1/2 tab (25mg  daily) x3-4 weeks and then stop the medication all together.  Check your BP 3-4x/week and record these numbers.  If consistently higher than 140/90- restart the medication Take the Diflucan for the yeast Call with any questions or concerns Happy Early Birthday!!!

## 2018-06-07 NOTE — Progress Notes (Signed)
   Subjective:    Patient ID: Bianca Hogan, female    DOB: 03-16-1956, 63 y.o.   MRN: [id number]  HPI CPE- UTD on mammo, colonoscopy, immunizations.  Pt is down 20 lbs since September doing [employer].  Pt feels great!   Review of Systems Patient reports no vision/ hearing changes, adenopathy,fever,  persistant/recurrent hoarseness , swallowing issues, chest pain, palpitations, edema, persistant/recurrent cough, hemoptysis, dyspnea (rest/exertional/paroxysmal nocturnal), gastrointestinal bleeding (melena, rectal bleeding), abdominal pain, significant heartburn, bowel changes, GU symptoms (dysuria, hematuria, incontinence),  syncope, focal weakness, memory loss, numbness & tingling, skin/hair/nail changes, abnormal bruising or bleeding, anxiety, or depression.   + yeast infection- pt was using a liner and now having itching, similar to previous yeast infxns    Objective:   Physical Exam General Appearance:    Alert, cooperative, no distress, appears stated age  Head:    Normocephalic, without obvious abnormality, atraumatic  Eyes:    PERRL, conjunctiva/corneas clear, EOM's intact, fundi    benign, both eyes  Ears:    Normal TM's and external ear canals, both ears  Nose:   Nares normal, septum midline, mucosa normal, no drainage    or sinus tenderness  Throat:   Lips, mucosa, and tongue normal; teeth and gums normal  Neck:   Supple, symmetrical, trachea midline, no adenopathy;    Thyroid: no enlargement/tenderness/nodules  Back:     Symmetric, no curvature, ROM normal, no CVA tenderness  Lungs:     Clear to auscultation bilaterally, respirations unlabored  Chest Wall:    No tenderness or deformity   Heart:    Regular rate and rhythm, S1 and S2 normal, no murmur, rub   or gallop  Breast Exam:    Deferred to mammo  Abdomen:     Soft, non-tender, bowel sounds active all four quadrants,    no masses, no organomegaly  Genitalia:    Deferred  Rectal:    Extremities:   Extremities normal,  atraumatic, no cyanosis or edema  Pulses:   2+ and symmetric all extremities  Skin:   Skin color, texture, turgor normal, no rashes or lesions  Lymph nodes:   Cervical, supraclavicular, and axillary nodes normal  Neurologic:   CNII-XII intact, normal strength, sensation and reflexes    throughout          Assessment & Plan:

## 2018-06-07 NOTE — Addendum Note (Signed)
Addended by: Davis Gourd on: 06/07/2018 08:24 AM   Modules accepted: Orders

## 2018-06-14 ENCOUNTER — Encounter: Payer: BC Managed Care – PPO | Admitting: Family Medicine

## 2018-06-19 ENCOUNTER — Encounter: Payer: Self-pay | Admitting: Family Medicine

## 2018-06-19 DIAGNOSIS — M79671 Pain in right foot: Secondary | ICD-10-CM

## 2018-06-22 ENCOUNTER — Telehealth: Payer: Self-pay | Admitting: *Deleted

## 2018-06-22 NOTE — Telephone Encounter (Signed)
Spoke with pt and schedule lab appt for Wednesday, 06/27/18 at 7am.

## 2018-06-22 NOTE — Telephone Encounter (Signed)
Copied from [place]. Topic: Appointment Scheduling - Scheduling Inquiry for Clinic >> Jun 22, 2018  7:49 AM Conception Chancy, NT wrote: Reason for CRM: patient is calling to schedule a lab appointment at this office. She is a patient of [facility]

## 2018-06-27 ENCOUNTER — Other Ambulatory Visit: Payer: Self-pay | Admitting: General Practice

## 2018-06-27 ENCOUNTER — Other Ambulatory Visit (INDEPENDENT_AMBULATORY_CARE_PROVIDER_SITE_OTHER): Payer: BC Managed Care – PPO

## 2018-06-27 DIAGNOSIS — E559 Vitamin D deficiency, unspecified: Secondary | ICD-10-CM | POA: Diagnosis not present

## 2018-06-27 DIAGNOSIS — E785 Hyperlipidemia, unspecified: Secondary | ICD-10-CM | POA: Diagnosis not present

## 2018-06-27 LAB — BASIC METABOLIC PANEL
BUN: 15 mg/dL (ref 6–23)
CHLORIDE: 106 meq/L (ref 96–112)
CO2: 27 mEq/L (ref 19–32)
Calcium: 9 mg/dL (ref 8.4–10.5)
Creatinine, Ser: 0.72 mg/dL (ref 0.40–1.20)
GFR: 81.82 mL/min (ref >60.00)
Glucose, Bld: 89 mg/dL (ref 70–99)
Potassium: 4.8 mEq/L (ref 3.5–5.1)
Sodium: 140 mEq/L (ref 135–145)

## 2018-06-27 LAB — CBC WITH DIFFERENTIAL/PLATELET
BASOS PCT: 0.4 % (ref 0.0–3.0)
Basophils Absolute: 0 10*3/uL (ref 0.0–0.1)
Eosinophils Absolute: 0.1 10*3/uL (ref 0.0–0.7)
Eosinophils Relative: 3.5 % (ref 0.0–5.0)
HCT: 42.8 % (ref 36.0–46.0)
Hemoglobin: 14.6 g/dL (ref 12.0–15.0)
Lymphocytes Relative: 30.1 % (ref 12.0–46.0)
Lymphs Abs: 1.3 10*3/uL (ref 0.7–4.0)
MCHC: 34.1 g/dL (ref 30.0–36.0)
MCV: 92.5 fl (ref 78.0–100.0)
Monocytes Absolute: 0.4 10*3/uL (ref 0.1–1.0)
Monocytes Relative: 8.6 % (ref 3.0–12.0)
NEUTROS ABS: 2.4 10*3/uL (ref 1.4–7.7)
Neutrophils Relative %: 57.4 % (ref 43.0–77.0)
Platelets: 228 10*3/uL (ref 150.0–400.0)
RBC: 4.63 Mil/uL (ref 3.87–5.11)
RDW: 13.4 % (ref 11.5–15.5)
WBC: 4.2 10*3/uL (ref 4.0–10.5)

## 2018-06-27 LAB — LIPID PANEL
CHOLESTEROL: 160 mg/dL (ref 0–200)
HDL: 52.3 mg/dL (ref >39.00)
LDL Cholesterol: 93 mg/dL (ref 0–99)
NonHDL: 108.08
Total CHOL/HDL Ratio: 3
Triglycerides: 75 mg/dL (ref 0.0–149.0)
VLDL: 15 mg/dL (ref 0.0–40.0)

## 2018-06-27 LAB — HEPATIC FUNCTION PANEL
ALT: 18 U/L (ref 0–35)
AST: 16 U/L (ref 0–37)
Albumin: 3.7 g/dL (ref 3.5–5.2)
Alkaline Phosphatase: 76 U/L (ref 39–117)
BILIRUBIN DIRECT: 0.1 mg/dL (ref 0.0–0.3)
Total Bilirubin: 0.5 mg/dL (ref 0.2–1.2)
Total Protein: 5.9 g/dL — ABNORMAL LOW (ref 6.0–8.3)

## 2018-06-27 LAB — TSH: TSH: 1.64 u[IU]/mL (ref 0.35–4.50)

## 2018-06-27 LAB — VITAMIN D 25 HYDROXY (VIT D DEFICIENCY, FRACTURES): VITD: 28.5 ng/mL — ABNORMAL LOW (ref 30.00–100.00)

## 2018-06-27 MED ORDER — VITAMIN D (ERGOCALCIFEROL) 1.25 MG (50000 UNIT) PO CAPS: 50000.0000 [IU] | ORAL_CAPSULE | ORAL | 0 refills | Status: DC

## 2018-07-03 ENCOUNTER — Ambulatory Visit (INDEPENDENT_AMBULATORY_CARE_PROVIDER_SITE_OTHER): Payer: BC Managed Care – PPO

## 2018-07-03 ENCOUNTER — Other Ambulatory Visit: Payer: Self-pay | Admitting: Podiatry

## 2018-07-03 ENCOUNTER — Ambulatory Visit (INDEPENDENT_AMBULATORY_CARE_PROVIDER_SITE_OTHER): Payer: BC Managed Care – PPO | Admitting: Podiatry

## 2018-07-03 VITALS — BP 135/79 | HR 57

## 2018-07-03 DIAGNOSIS — M779 Enthesopathy, unspecified: Secondary | ICD-10-CM | POA: Diagnosis not present

## 2018-07-03 DIAGNOSIS — M2021 Hallux rigidus, right foot: Secondary | ICD-10-CM | POA: Diagnosis not present

## 2018-07-03 DIAGNOSIS — M7741 Metatarsalgia, right foot: Secondary | ICD-10-CM

## 2018-07-03 DIAGNOSIS — M79671 Pain in right foot: Secondary | ICD-10-CM

## 2018-07-03 MED ORDER — MELOXICAM 7.5 MG PO TABS: 7.5000 mg | ORAL_TABLET | Freq: Every day | ORAL | 0 refills | Status: DC

## 2018-07-03 NOTE — Patient Instructions (Signed)
Meloxicam tablets What is this medicine? MELOXICAM (mel OX i cam) is a non-steroidal anti-inflammatory drug (NSAID). It is used to reduce swelling and to treat pain. It may be used for osteoarthritis, rheumatoid arthritis, or juvenile rheumatoid arthritis. This medicine may be used for other purposes; ask your health care provider or pharmacist if you have questions. COMMON BRAND NAME(S): Mobic What should I tell my health care provider before I take this medicine? They need to know if you have any of these conditions: -bleeding disorders -cigarette smoker -coronary artery bypass graft (CABG) surgery within the past 2 weeks -drink more than 3 alcohol-containing drinks per day -heart disease -high blood pressure -history of stomach bleeding -kidney disease -liver disease -lung or breathing disease, like asthma -stomach or intestine problems -an unusual or allergic reaction to meloxicam, aspirin, other NSAIDs, other medicines, foods, dyes, or preservatives -pregnant or trying to get pregnant -breast-feeding How should I use this medicine? Take this medicine by mouth with a full glass of water. Follow the directions on the prescription label. You can take it with or without food. If it upsets your stomach, take it with food. Take your medicine at regular intervals. Do not take it more often than directed. Do not stop taking except on your doctor's advice. A special MedGuide will be given to you by the pharmacist with each prescription and refill. Be sure to read this information carefully each time. Talk to your pediatrician regarding the use of this medicine in children. While this drug may be prescribed for selected conditions, precautions do apply. Patients over 65 years old may have a stronger reaction and need a smaller dose. Overdosage: If you think you have taken too much of this medicine contact a poison control center or emergency room at once. NOTE: This medicine is only for you. Do  not share this medicine with others. What if I miss a dose? If you miss a dose, take it as soon as you can. If it is almost time for your next dose, take only that dose. Do not take double or extra doses. What may interact with this medicine? Do not take this medicine with any of the following medications: -cidofovir -ketorolac This medicine may also interact with the following medications: -aspirin and aspirin-like medicines -certain medicines for blood pressure, heart disease, irregular heart beat -certain medicines for depression, anxiety, or psychotic disturbances -certain medicines that treat or prevent blood clots like warfarin, enoxaparin, dalteparin, apixaban, dabigatran, rivaroxaban -cyclosporine -diuretics -methotrexate -other NSAIDs, medicines for pain and inflammation, like ibuprofen and naproxen -pemetrexed This list may not describe all possible interactions. Give your health care provider a list of all the medicines, herbs, non-prescription drugs, or dietary supplements you use. Also tell them if you smoke, drink alcohol, or use illegal drugs. Some items may interact with your medicine. What should I watch for while using this medicine? Tell your doctor or healthcare professional if your symptoms do not start to get better or if they get worse. Do not take other medicines that contain aspirin, ibuprofen, or naproxen with this medicine. Side effects such as stomach upset, nausea, or ulcers may be more likely to occur. Many medicines available without a prescription should not be taken with this medicine. This medicine can cause ulcers and bleeding in the stomach and intestines at any time during treatment. This can happen with no warning and may cause death. There is increased risk with taking this medicine for a long time. Smoking, drinking alcohol, older age, and   poor health can also increase risks. Call your doctor right away if you have stomach pain or blood in your vomit or  stool. This medicine does not prevent heart attack or stroke. In fact, this medicine may increase the chance of a heart attack or stroke. The chance may increase with longer use of this medicine and in people who have heart disease. If you take aspirin to prevent heart attack or stroke, talk with your doctor or health care professional. What side effects may I notice from receiving this medicine? Side effects that you should report to your doctor or health care professional as soon as possible: -allergic reactions like skin rash, itching or hives, swelling of the face, lips, or tongue -nausea, vomiting -signs and symptoms of a blood clot such as breathing problems; changes in vision; chest pain; severe, sudden headache; pain, swelling, warmth in the leg; trouble speaking; sudden numbness or weakness of the face, arm, or leg -signs and symptoms of bleeding such as bloody or black, tarry stools; red or dark-brown urine; spitting up blood or brown material that looks like coffee grounds; red spots on the skin; unusual bruising or bleeding from the eye, gums, or nose -signs and symptoms of liver injury like dark yellow or brown urine; general ill feeling or flu-like symptoms; light-colored stools; loss of appetite; nausea; right upper belly pain; unusually weak or tired; yellowing of the eyes or skin -signs and symptoms of stroke like changes in vision; confusion; trouble speaking or understanding; severe headaches; sudden numbness or weakness of the face, arm, or leg; trouble walking; dizziness; loss of balance or coordination Side effects that usually do not require medical attention (report to your doctor or health care professional if they continue or are bothersome): -constipation -diarrhea -gas This list may not describe all possible side effects. Call your doctor for medical advice about side effects. You may report side effects to FDA at 1-800-FDA-1088. Where should I keep my medicine? Keep out  of the reach of children. Store at room temperature between 15 and 30 degrees C (59 and 86 degrees F). Throw away any unused medicine after the expiration date. NOTE: This sheet is a summary. It may not cover all possible information. If you have questions about this medicine, talk to your doctor, pharmacist, or health care provider.  2019 Elsevier/Gold Standard (2017-09-01 11:22:56)  

## 2018-07-04 DIAGNOSIS — M2021 Hallux rigidus, right foot: Secondary | ICD-10-CM | POA: Insufficient documentation

## 2018-07-04 DIAGNOSIS — M7741 Metatarsalgia, right foot: Secondary | ICD-10-CM | POA: Insufficient documentation

## 2018-07-04 NOTE — Progress Notes (Signed)
Subjective:   Patient ID: Bianca Hogan, female   DOB: 63 y.o.   MRN: [id number]   HPI Bianca Hogan presents the office today for concerns of pain and swelling to the right forefoot which is been ongoing last 2 weeks.  She states that it is more uncomfortable and causing pain and she gets discomfort though when she goes barefoot.  She states that she has noticed her third and fourth toes and splitting as well over time.  She states that she also has arthritis in the foot.  She said no recent treatment for this issue.  No recent injury or trauma.  She has no numbness or tingling or any burning into the toes.  She has no other concerns.   Review of Systems  All other systems reviewed and are negative.  Past Medical History:  Diagnosis Date  . Allergy   . Bronchitis 01/23/2011  . GERD (gastroesophageal reflux disease)   . HLD (hyperlipidemia)   . Hypertension   . Osteoarthritis   . Shortness of breath    dyspenea; echo 12/10 EF 55-60%, nml; myoview 12/10 EF 83%, nml    Past Surgical History:  Procedure Laterality Date  . BLADDER SUSPENSION  12/08  . chest CT  7/08   pulmonary nodule Rt middle   . COLONOSCOPY    . CYSTOCELE REPAIR    . DILATION AND CURETTAGE OF UTERUS  1986   miscarriage   . POLYPECTOMY    . TONSILLECTOMY    . VAGINAL HYSTERECTOMY     no cervix     Current Outpatient Medications:  .  Ascorbic Acid (VITAMIN C PO), Take 1 capsule by mouth daily., Disp: , Rfl:  .  aspirin 81 MG tablet, Take 81 mg by mouth daily., Disp: , Rfl:  .  atorvastatin (LIPITOR) 20 MG tablet, TAKE 1 TABLET BY MOUTH DAILY, Disp: 90 tablet, Rfl: 3 .  Cholecalciferol (VITAMIN D) 2000 units tablet, Take 2,000 Units by mouth daily., Disp: , Rfl:  .  clotrimazole-betamethasone (LOTRISONE) cream, APPLY EXTERNALLY TO THE AFFECTED AREA TWICE DAILY, Disp: 30 g, Rfl: 0 .  Cyanocobalamin (VITAMIN B-12 PO), Take 1 tablet by mouth daily.  , Disp: , Rfl:  .  estradiol (ESTRACE) 0.5 MG tablet, TAKE 1  TABLET(0.5 MG) BY MOUTH DAILY, Disp: 90 tablet, Rfl: 0 .  ferrous sulfate 325 (65 FE) MG tablet, Take 325 mg by mouth daily with breakfast.  , Disp: , Rfl:  .  fluconazole (DIFLUCAN) 150 MG tablet, , Disp: , Rfl:  .  fluticasone (FLONASE) 50 MCG/ACT nasal spray, Place 2 sprays into both nostrils daily., Disp: 16 g, Rfl: 6 .  Glucosamine-Chondroit-Vit C-Mn CAPS, Take 2 capsules by mouth daily. Takes two  daily , Disp: , Rfl:  .  KRILL OIL PO, Take 321 mg by mouth., Disp: , Rfl:  .  losartan (COZAAR) 50 MG tablet, TAKE 1 TABLET BY MOUTH DAILY, Disp: 90 tablet, Rfl: 3 .  Lysine HCl (L-FORMULA LYSINE HCL) 500 MG TABS, Take by mouth., Disp: , Rfl:  .  meloxicam (MOBIC) 7.5 MG tablet, Take 1 tablet (7.5 mg total) by mouth daily., Disp: 14 tablet, Rfl: 0 .  montelukast (SINGULAIR) 10 MG tablet, TAKE 1 TABLET(10 MG) BY MOUTH AT BEDTIME, Disp: 30 tablet, Rfl: 6 .  Multiple Vitamins-Minerals (MULTIVITAMIN WITH MINERALS) tablet, Take 1 tablet by mouth daily.  , Disp: , Rfl:  .  pantoprazole (PROTONIX) 40 MG tablet, TAKE 1 TABLET BY MOUTH EVERY DAY, Disp: 90 tablet,  Rfl: 0 .  Vitamin D, Ergocalciferol, (DRISDOL) 1.25 MG (50000 UT) CAPS capsule, Take 1 capsule (50,000 Units total) by mouth every 7 (seven) days., Disp: 12 capsule, Rfl: 0  Allergies  Allergen Reactions  . Lisinopril Cough  . Naproxen Nausea Only          Objective:  Physical Exam  General: AAO x3, NAD  Dermatological: Skin is warm, dry and supple bilateral. Nails x 10 are well manicured; remaining integument appears unremarkable at this time. There are no open sores, no preulcerative lesions, no rash or signs of infection present.  Vascular: Dorsalis Pedis artery and Posterior Tibial artery pedal pulses are 2/4 bilateral with immedate capillary fill time. Pedal hair growth present. No varicosities and no lower extremity edema present bilateral. There is no pain with calf compression, swelling, warmth, erythema.   Neruologic: Grossly  intact via light touch bilateral.Protective threshold with Semmes Wienstein monofilament intact to all pedal sites bilateral.   Musculoskeletal: There is discomfort submetatarsal 2, 3, 4 on the right foot there is localized edema plantarly to this area.  There is no erythema or increase in warmth.  There is no pain to the dorsal MPJs.  I am not able to palpate a neuroma.  There is splaying of the third and fourth toes on the right foot.  There is a minimal range of motion present first MPJ consistent with hallux rigidus.  No other areas of tenderness.  Muscular strength 5/5 in all groups tested bilateral.  Gait: Unassisted, Nonantalgic.       Assessment:   Capsulitis right foot, metatarsalgia; hallux rigidus    Plan:  -Treatment options discussed including all alternatives, risks, and complications -Etiology of symptoms were discussed -X-rays were obtained and reviewed with the patient.  Arthritic changes present the first MPJ.  Elongated second metatarsal appearing splaying of the third and fourth toes.  No evidence of acute fracture. -I am not able to identify any palpable area of neuroma.  I think more of her discomfort is coming from capsulitis positive some swelling.  Today prescribed meloxicam discussed Adipex.  Also metatarsal offloading pads as well the gel offloading pad was dispensed.  Will check orthotic coverage.  We discussed wearing stiffer soled shoe.  Trula Slade DPM

## 2018-07-28 ENCOUNTER — Other Ambulatory Visit: Payer: Self-pay | Admitting: Physician Assistant

## 2018-07-28 DIAGNOSIS — H6983 Other specified disorders of Eustachian tube, bilateral: Secondary | ICD-10-CM

## 2018-08-07 ENCOUNTER — Ambulatory Visit (INDEPENDENT_AMBULATORY_CARE_PROVIDER_SITE_OTHER): Payer: BC Managed Care – PPO | Admitting: Orthotics

## 2018-08-07 ENCOUNTER — Other Ambulatory Visit: Payer: Self-pay

## 2018-08-07 ENCOUNTER — Ambulatory Visit (INDEPENDENT_AMBULATORY_CARE_PROVIDER_SITE_OTHER): Payer: BC Managed Care – PPO | Admitting: Podiatry

## 2018-08-07 DIAGNOSIS — M779 Enthesopathy, unspecified: Secondary | ICD-10-CM

## 2018-08-07 DIAGNOSIS — M7741 Metatarsalgia, right foot: Secondary | ICD-10-CM

## 2018-08-07 DIAGNOSIS — M7752 Other enthesopathy of left foot: Secondary | ICD-10-CM

## 2018-08-07 DIAGNOSIS — M7751 Other enthesopathy of right foot: Secondary | ICD-10-CM

## 2018-08-07 DIAGNOSIS — M2021 Hallux rigidus, right foot: Secondary | ICD-10-CM

## 2018-08-07 NOTE — Progress Notes (Signed)
Patient is here today to be evaluated and cast for CMFO.  Patient has hx of functional hallux limitus (FHL), and needs a supportive orthoses that will plantarflex first ray in order to lower hinge pin of first MPJ and enhance windless effect.  Plan of deep heel cup, hug arch, and reverse mortons extension.  Richy to fab.  

## 2018-08-13 NOTE — Progress Notes (Signed)
Subjective: 63 year old female presents the office today for follow-up evaluation of right forefoot pain.  She states that overall is not too bad.  She states the medicine is helping.  She states that soaking appointment times.  She denies any recent injury or trauma any changes since I last saw her. Denies any systemic complaints such as fevers, chills, nausea, vomiting. No acute changes since last appointment, and no other complaints at this time.   Objective: AAO x3, NAD DP/PT pulses palpable bilaterally, CRT less than 3 seconds There is currently no area of pinpoint tenderness or pain to the right foot.  There is some swelling present on the third interspace plantarly there is no palpable neuroma identified at this time.  There is no pain at the MPJs there is no area pinpoint bony tenderness or pain to vibratory sensation.  Decreased range of motion of the first MPJ No open lesions or pre-ulcerative lesions.  No pain with calf compression, swelling, warmth, erythema  Assessment: Right foot metatarsalgia  Plan: -All treatment options discussed with the patient including all alternatives, risks, complications.  -There is no significant discomfort.  However we discussed continue offloading at all times.  She does have some swelling present the third interspace plantarly.  Concern for possible neuroma although not able to palpate it.  No pain today so we cut off on the injection.  Consider ultrasound to evaluate the area if needed.  Unfortunately cannot do this at this time given the coronavirus.  Discussed  -Patient encouraged to call the office with any questions, concerns, change in symptoms.   Trula Slade DPM

## 2018-09-04 ENCOUNTER — Other Ambulatory Visit: Payer: Self-pay

## 2018-09-04 ENCOUNTER — Ambulatory Visit: Payer: BC Managed Care – PPO | Admitting: Orthotics

## 2018-09-04 DIAGNOSIS — M779 Enthesopathy, unspecified: Secondary | ICD-10-CM

## 2018-09-04 DIAGNOSIS — M7741 Metatarsalgia, right foot: Secondary | ICD-10-CM

## 2018-09-04 DIAGNOSIS — M2021 Hallux rigidus, right foot: Secondary | ICD-10-CM

## 2018-09-04 NOTE — Progress Notes (Signed)
Patient came in today to pick up custom made foot orthotics.  The goals were accomplished and the patient reported no dissatisfaction with said orthotics.  Patient was advised of breakin period and how to report any issues. 

## 2018-09-20 ENCOUNTER — Ambulatory Visit: Payer: BC Managed Care – PPO | Admitting: Podiatry

## 2018-10-10 ENCOUNTER — Other Ambulatory Visit: Payer: Self-pay | Admitting: Family Medicine

## 2018-10-22 ENCOUNTER — Encounter: Payer: Self-pay | Admitting: Podiatry

## 2018-10-22 ENCOUNTER — Ambulatory Visit (INDEPENDENT_AMBULATORY_CARE_PROVIDER_SITE_OTHER): Payer: BC Managed Care – PPO | Admitting: Podiatry

## 2018-10-22 ENCOUNTER — Other Ambulatory Visit: Payer: Self-pay

## 2018-10-22 VITALS — Temp 97.2°F

## 2018-10-22 DIAGNOSIS — M7741 Metatarsalgia, right foot: Secondary | ICD-10-CM | POA: Diagnosis not present

## 2018-10-22 DIAGNOSIS — D3610 Benign neoplasm of peripheral nerves and autonomic nervous system, unspecified: Secondary | ICD-10-CM

## 2018-10-22 NOTE — Progress Notes (Signed)
Subjective: 63 year old female presents the office today for follow-up evaluation of right foot pain.  She states that she has been using orthotics and getting used to them.  Overall she states that she is to have the same.  She occasionally gets some numbness in the third and fourth toes but overall the mostly is localized to the ball.  She did get 1 day having similar symptoms of the left foot in the same area but that only occurred once. Denies any systemic complaints such as fevers, chills, nausea, vomiting. No acute changes since last appointment, and no other complaints at this time.   Objective: AAO x3, NAD DP/PT pulses palpable bilaterally, CRT less than 3 seconds There is mild tenderness palpation on the third interspace of the right foot.  Today there is a palpable neuroma identified.  There is splaying of the third and fourth toes.  There is no area of tenderness in the metatarsals or metatarsal heads.  No edema, erythema.  No pain to the left foot. No open lesions or pre-ulcerative lesions.  No pain with calf compression, swelling, warmth, erythema  Assessment: Neuroma right foot  Plan: -All treatment options discussed with the patient including all alternatives, risks, complications.  -At this time she states that she is having minimal discomfort but still present.  I offered a steroid injection which she declined.  Also also order for anti-inflammatories.  I added a neuroma pad to the orthotic to see if this will be helpful.  If this does help then we can incorporate this permanently into the orthotic. -Patient encouraged to call the office with any questions, concerns, change in symptoms.   Trula Slade DPM

## 2018-12-03 ENCOUNTER — Ambulatory Visit: Payer: BC Managed Care – PPO | Admitting: Family Medicine

## 2018-12-12 ENCOUNTER — Other Ambulatory Visit: Payer: Self-pay

## 2018-12-12 ENCOUNTER — Ambulatory Visit (INDEPENDENT_AMBULATORY_CARE_PROVIDER_SITE_OTHER): Payer: BC Managed Care – PPO | Admitting: Family Medicine

## 2018-12-12 ENCOUNTER — Encounter: Payer: Self-pay | Admitting: Family Medicine

## 2018-12-12 VITALS — BP 130/79 | HR 81 | Temp 97.8°F | Resp 17 | Ht 65.0 in | Wt 178.0 lb

## 2018-12-12 DIAGNOSIS — E785 Hyperlipidemia, unspecified: Secondary | ICD-10-CM | POA: Diagnosis not present

## 2018-12-12 DIAGNOSIS — Z23 Encounter for immunization: Secondary | ICD-10-CM

## 2018-12-12 DIAGNOSIS — I10 Essential (primary) hypertension: Secondary | ICD-10-CM | POA: Diagnosis not present

## 2018-12-12 DIAGNOSIS — R7309 Other abnormal glucose: Secondary | ICD-10-CM | POA: Diagnosis not present

## 2018-12-12 LAB — CBC WITH DIFFERENTIAL/PLATELET
Basophils Absolute: 0 10*3/uL (ref 0.0–0.1)
Basophils Relative: 0.7 % (ref 0.0–3.0)
Eosinophils Absolute: 0.1 10*3/uL (ref 0.0–0.7)
Eosinophils Relative: 2.3 % (ref 0.0–5.0)
HCT: 42.6 % (ref 36.0–46.0)
Hemoglobin: 14.4 g/dL (ref 12.0–15.0)
Lymphocytes Relative: 32.5 % (ref 12.0–46.0)
Lymphs Abs: 1.5 10*3/uL (ref 0.7–4.0)
MCHC: 33.7 g/dL (ref 30.0–36.0)
MCV: 95.3 fl (ref 78.0–100.0)
Monocytes Absolute: 0.4 10*3/uL (ref 0.1–1.0)
Monocytes Relative: 9.9 % (ref 3.0–12.0)
Neutro Abs: 2.5 10*3/uL (ref 1.4–7.7)
Neutrophils Relative %: 54.6 % (ref 43.0–77.0)
Platelets: 217 10*3/uL (ref 150.0–400.0)
RBC: 4.47 Mil/uL (ref 3.87–5.11)
RDW: 13.1 % (ref 11.5–15.5)
WBC: 4.5 10*3/uL (ref 4.0–10.5)

## 2018-12-12 LAB — HEMOGLOBIN A1C: Hgb A1c MFr Bld: 5.8 % (ref 4.6–6.5)

## 2018-12-12 LAB — BASIC METABOLIC PANEL
BUN: 16 mg/dL (ref 6–23)
CO2: 28 mEq/L (ref 19–32)
Calcium: 9 mg/dL (ref 8.4–10.5)
Chloride: 105 mEq/L (ref 96–112)
Creatinine, Ser: 0.62 mg/dL (ref 0.40–1.20)
GFR: 97.08 mL/min (ref >60.00)
Glucose, Bld: 91 mg/dL (ref 70–99)
Potassium: 4 mEq/L (ref 3.5–5.1)
Sodium: 139 mEq/L (ref 135–145)

## 2018-12-12 LAB — HEPATIC FUNCTION PANEL
ALT: 15 U/L (ref 0–35)
AST: 18 U/L (ref 0–37)
Albumin: 4 g/dL (ref 3.5–5.2)
Alkaline Phosphatase: 80 U/L (ref 39–117)
Bilirubin, Direct: 0.1 mg/dL (ref 0.0–0.3)
Total Bilirubin: 0.6 mg/dL (ref 0.2–1.2)
Total Protein: 6.4 g/dL (ref 6.0–8.3)

## 2018-12-12 LAB — LIPID PANEL
Cholesterol: 234 mg/dL — ABNORMAL HIGH (ref 0–200)
HDL: 82.3 mg/dL (ref >39.00)
LDL Cholesterol: 136 mg/dL — ABNORMAL HIGH (ref 0–99)
NonHDL: 152.01
Total CHOL/HDL Ratio: 3
Triglycerides: 80 mg/dL (ref 0.0–149.0)
VLDL: 16 mg/dL (ref 0.0–40.0)

## 2018-12-12 LAB — TSH: TSH: 1.54 u[IU]/mL (ref 0.35–4.50)

## 2018-12-12 NOTE — Assessment & Plan Note (Signed)
Weight is stable.  Applauded her efforts at exercise.  Check labs to assess.  Will follow

## 2018-12-12 NOTE — Progress Notes (Signed)
   Subjective:    Patient ID: Bianca Hogan, female    DOB: May 28, 1955, 63 y.o.   MRN: [id number]  HPI HTN- chronic problem, currently not on medication.  We were able to stop medications and thus far she has been able to control.  Denies CP, SOB, HAs, visual changes, edema.  Hyperlipidemia- chronic problem, on Lipitor 20mg  daily.  Denies abd pain, N/V.  Prediabetes- weight is stable since January.  She reports she had actually gained weight during COVID but has lost it again.  Walking regularly.   Review of Systems For ROS see HPI     Objective:   Physical Exam Vitals signs reviewed.  Constitutional:      General: She is not in acute distress.    Appearance: She is well-developed.  HENT:     Head: Normocephalic and atraumatic.  Eyes:     Conjunctiva/sclera: Conjunctivae normal.     Pupils: Pupils are equal, round, and reactive to light.  Neck:     Musculoskeletal: Normal range of motion and neck supple.     Thyroid: No thyromegaly.  Cardiovascular:     Rate and Rhythm: Normal rate and regular rhythm.     Heart sounds: Normal heart sounds. No murmur.  Pulmonary:     Effort: Pulmonary effort is normal. No respiratory distress.     Breath sounds: Normal breath sounds.  Abdominal:     General: There is no distension.     Palpations: Abdomen is soft.     Tenderness: There is no abdominal tenderness.  Lymphadenopathy:     Cervical: No cervical adenopathy.  Skin:    General: Skin is warm and dry.  Neurological:     Mental Status: She is alert and oriented to person, place, and time.  Psychiatric:        Behavior: Behavior normal.           Assessment & Plan:

## 2018-12-12 NOTE — Assessment & Plan Note (Signed)
Chronic problem, tolerating statin w/o difficulty.  Applauded efforts at exercise.  Check labs.  Adjust meds prn

## 2018-12-12 NOTE — Patient Instructions (Signed)
Schedule your complete physical in 6 months We'll notify you of your lab results and make any changes if needed Keep up the good work on healthy diet and regular exercise- you're doing great! Call with any questions or concerns Stay Safe!! 

## 2018-12-29 ENCOUNTER — Other Ambulatory Visit: Payer: Self-pay | Admitting: Family Medicine

## 2019-01-04 ENCOUNTER — Other Ambulatory Visit: Payer: Self-pay | Admitting: Family Medicine

## 2019-01-21 ENCOUNTER — Other Ambulatory Visit: Payer: Self-pay | Admitting: Family Medicine

## 2019-01-22 NOTE — Telephone Encounter (Signed)
Dr. Tabori patient.  

## 2019-02-25 ENCOUNTER — Other Ambulatory Visit: Payer: Self-pay | Admitting: General Practice

## 2019-02-25 MED ORDER — ESTRADIOL 0.5 MG PO TABS: ORAL_TABLET | ORAL | 0 refills | Status: DC

## 2019-03-05 ENCOUNTER — Other Ambulatory Visit: Payer: Self-pay | Admitting: General Practice

## 2019-03-05 MED ORDER — PANTOPRAZOLE SODIUM 40 MG PO TBEC: 40.0000 mg | DELAYED_RELEASE_TABLET | Freq: Every day | ORAL | 0 refills | Status: DC

## 2019-05-03 ENCOUNTER — Other Ambulatory Visit: Payer: Self-pay | Admitting: General Practice

## 2019-05-03 MED ORDER — PANTOPRAZOLE SODIUM 40 MG PO TBEC: 40.0000 mg | DELAYED_RELEASE_TABLET | Freq: Every day | ORAL | 0 refills | Status: DC

## 2019-05-03 MED ORDER — ESTRADIOL 0.5 MG PO TABS: ORAL_TABLET | ORAL | 0 refills | Status: DC

## 2019-05-15 ENCOUNTER — Other Ambulatory Visit: Payer: Self-pay | Admitting: Family Medicine

## 2019-05-15 DIAGNOSIS — Z1231 Encounter for screening mammogram for malignant neoplasm of breast: Secondary | ICD-10-CM

## 2019-05-20 ENCOUNTER — Ambulatory Visit
Admission: RE | Admit: 2019-05-20 | Discharge: 2019-05-20 | Disposition: A | Discharge status: Home / Self Care | Payer: BC Managed Care – PPO | Source: Ambulatory Visit

## 2019-05-20 ENCOUNTER — Other Ambulatory Visit: Payer: Self-pay

## 2019-05-20 DIAGNOSIS — Z1231 Encounter for screening mammogram for malignant neoplasm of breast: Secondary | ICD-10-CM

## 2019-06-13 ENCOUNTER — Other Ambulatory Visit: Payer: Self-pay

## 2019-06-13 ENCOUNTER — Encounter: Payer: Self-pay | Admitting: Family Medicine

## 2019-06-13 ENCOUNTER — Ambulatory Visit (INDEPENDENT_AMBULATORY_CARE_PROVIDER_SITE_OTHER): Payer: BC Managed Care – PPO | Admitting: Family Medicine

## 2019-06-13 VITALS — BP 145/93 | HR 69 | Ht 64.75 in | Wt 185.0 lb

## 2019-06-13 DIAGNOSIS — E559 Vitamin D deficiency, unspecified: Secondary | ICD-10-CM

## 2019-06-13 DIAGNOSIS — Z0000 Encounter for general adult medical examination without abnormal findings: Secondary | ICD-10-CM | POA: Diagnosis not present

## 2019-06-13 DIAGNOSIS — I10 Essential (primary) hypertension: Secondary | ICD-10-CM

## 2019-06-13 DIAGNOSIS — E669 Obesity, unspecified: Secondary | ICD-10-CM | POA: Diagnosis not present

## 2019-06-13 NOTE — Progress Notes (Signed)
I have discussed the procedure for the virtual visit with the patient who has given consent to proceed with assessment and treatment.   Cari Vandeberg L Scout Guyett, CMA     

## 2019-06-13 NOTE — Progress Notes (Signed)
Virtual Visit via Video   I connected with patient on 06/13/19 at  3:00 PM EST by a video enabled telemedicine application and verified that I am speaking with the correct person using two identifiers.  Location patient: Home Location provider: [facility], Office Persons participating in the virtual visit: Patient, Provider, [place] (Jess B)  I discussed the limitations of evaluation and management by telemedicine and the availability of in person appointments. The patient expressed understanding and agreed to proceed.  Subjective:   HPI:   CPE- UTD on mammo, colonoscopy, Tdap.  Pt isn't sure if she had flu shot or not.  Plans to get COVID vaccine.  Pt has gained 7 lbs since last visit.  HTN- BP is elevated today.  Pt has not been taking home BPs and is surprised by today's reading.  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  Denies CP, SOB, HAs, visual changes, edema.  ROS:  Patient reports no vision/ hearing changes, adenopathy,fever, persistant/recurrent hoarseness , swallowing issues, chest pain, palpitations, edema, persistant/recurrent cough, hemoptysis, dyspnea (rest/exertional/paroxysmal nocturnal), gastrointestinal bleeding (melena, rectal bleeding), abdominal pain, significant heartburn, bowel changes, GU symptoms (dysuria, hematuria, incontinence), Gyn symptoms (abnormal  bleeding, pain),  syncope, focal weakness, memory loss, numbness & tingling, skin/hair/nail changes, abnormal bruising or bleeding, anxiety, or depression.   Patient Active Problem List   Diagnosis Date Noted  . Metatarsalgia, right foot 07/04/2018  . Hallux rigidus, right foot 07/04/2018  . Eustachian tube dysfunction, bilateral 10/02/2017  . Ganglion cyst of dorsum of right wrist 03/27/2017  . Vitamin D deficiency 09/28/2016  . Urinary incontinence, mixed 03/25/2015  . Left hip pain 03/25/2015  . Abdominal bloating 06/05/2014  . Postmenopausal HRT (hormone replacement  therapy) 07/24/2013  . Routine general medical examination at a health care facility 09/19/2012  . COLONIC POLYPS, ADENOMATOUS, HX OF 06/29/2010  . PALPITATIONS, HX OF 06/12/2009  . HALLUX RIGIDUS, ACQUIRED 06/03/2008  . PREDIABETES 06/02/2008  . Hyperlipidemia 03/28/2008  . Essential hypertension 03/28/2008  . GERD 03/28/2008  . OSTEOARTHRITIS 03/28/2008  . PULMONARY NODULE 10/03/2005    Social History   Tobacco Use  . Smoking status: Former Smoker    Quit date: 05/25/1987    Years since quitting: 32.0  . Smokeless tobacco: Never Used  Substance Use Topics  . Alcohol use: Yes    Alcohol/week: 12.0 standard drinks    Types: 12 Glasses of wine per week    Comment: 1-2 beers daily    Current Outpatient Medications:  .  Ascorbic Acid (VITAMIN C PO), Take 1 capsule by mouth daily., Disp: , Rfl:  .  aspirin 81 MG tablet, Take 81 mg by mouth daily., Disp: , Rfl:  .  atorvastatin (LIPITOR) 20 MG tablet, TAKE 1 TABLET BY MOUTH DAILY, Disp: 90 tablet, Rfl: 0 .  Cholecalciferol (VITAMIN D) 2000 units tablet, Take 2,000 Units by mouth daily., Disp: , Rfl:  .  clotrimazole-betamethasone (LOTRISONE) cream, APPLY EXTERNALLY TO THE AFFECTED AREA TWICE DAILY, Disp: 30 g, Rfl: 0 .  Cyanocobalamin (VITAMIN B-12 PO), Take 1 tablet by mouth daily.  , Disp: , Rfl:  .  estradiol (ESTRACE) 0.5 MG tablet, TAKE 1 TABLET(0.5 MG) BY MOUTH DAILY, Disp: 90 tablet, Rfl: 0 .  ferrous sulfate 325 (65 FE) MG tablet, Take 325 mg by mouth daily with breakfast.  , Disp: , Rfl:  .  fluticasone (FLONASE) 50 MCG/ACT nasal spray, SHAKE LIQUID AND USE 2 SPRAYS IN EACH NOSTRIL DAILY, Disp: 16 g,  Rfl: 6 .  Glucosamine-Chondroit-Vit C-Mn CAPS, Take 2 capsules by mouth daily. Takes two  daily , Disp: , Rfl:  .  KRILL OIL PO, Take 321 mg by mouth., Disp: , Rfl:  .  Lysine HCl (L-FORMULA LYSINE HCL) 500 MG TABS, Take by mouth., Disp: , Rfl:  .  montelukast (SINGULAIR) 10 MG tablet, TAKE 1 TABLET(10 MG) BY MOUTH AT BEDTIME,  Disp: 30 tablet, Rfl: 6 .  Multiple Vitamins-Minerals (MULTIVITAMIN WITH MINERALS) tablet, Take 1 tablet by mouth daily.  , Disp: , Rfl:  .  pantoprazole (PROTONIX) 40 MG tablet, Take 1 tablet (40 mg total) by mouth daily., Disp: 90 tablet, Rfl: 0  Allergies  Allergen Reactions  . Lisinopril Cough  . Naproxen Nausea Only    Objective:   BP (!) 145/93   Pulse 69   Ht 5' 4.75" (1.645 m)   Wt 185 lb (83.9 kg)   BMI 31.02 kg/m  AAOx3, NAD NCAT, EOMI No obvious CN deficits Coloring WNL Pt is able to speak clearly, coherently without shortness of breath or increased work of breathing.  Thought process is linear.  Mood is appropriate.   Assessment and Plan:   CPE- UTD on mammo, colonoscopy, Tdap.  Pt is not sure if she had flu or not but is planning on COVID vaccine so will defer flu at this time.  Check labs.  Anticipatory guidance provided.   Obesity- deteriorated.  Pt has gained 7 lbs since last visit.  Stressed need for healthy diet and regular exercise.  Check labs to risk stratify.  Will follow.  HTN- deteriorated.  Pt was able to successfully wean off Losartan but has not been check BP regularly.  BP is again elevated.  She is asymptomatic and was surprised by today's reading.  She will check her BP at home over the next week and let me know the readings so we can restart meds if needed.  Pt expressed understanding and is in agreement w/ plan.    Annye Asa, MD 06/13/2019

## 2019-06-21 ENCOUNTER — Other Ambulatory Visit (INDEPENDENT_AMBULATORY_CARE_PROVIDER_SITE_OTHER): Payer: BC Managed Care – PPO

## 2019-06-21 ENCOUNTER — Other Ambulatory Visit: Payer: Self-pay

## 2019-06-21 DIAGNOSIS — E559 Vitamin D deficiency, unspecified: Secondary | ICD-10-CM

## 2019-06-21 DIAGNOSIS — I10 Essential (primary) hypertension: Secondary | ICD-10-CM | POA: Diagnosis not present

## 2019-06-21 LAB — CBC WITH DIFFERENTIAL/PLATELET
Basophils Absolute: 0 10*3/uL (ref 0.0–0.1)
Basophils Relative: 0.3 % (ref 0.0–3.0)
Eosinophils Absolute: 0.1 10*3/uL (ref 0.0–0.7)
Eosinophils Relative: 2.6 % (ref 0.0–5.0)
HCT: 41.9 % (ref 36.0–46.0)
Hemoglobin: 14 g/dL (ref 12.0–15.0)
Lymphocytes Relative: 33.6 % (ref 12.0–46.0)
Lymphs Abs: 1.5 10*3/uL (ref 0.7–4.0)
MCHC: 33.5 g/dL (ref 30.0–36.0)
MCV: 94.2 fl (ref 78.0–100.0)
Monocytes Absolute: 0.4 10*3/uL (ref 0.1–1.0)
Monocytes Relative: 8.8 % (ref 3.0–12.0)
Neutro Abs: 2.5 10*3/uL (ref 1.4–7.7)
Neutrophils Relative %: 54.7 % (ref 43.0–77.0)
Platelets: 234 10*3/uL (ref 150.0–400.0)
RBC: 4.45 Mil/uL (ref 3.87–5.11)
RDW: 12.4 % (ref 11.5–15.5)
WBC: 4.5 10*3/uL (ref 4.0–10.5)

## 2019-06-21 LAB — LIPID PANEL
Cholesterol: 184 mg/dL (ref 0–200)
HDL: 66.1 mg/dL (ref >39.00)
LDL Cholesterol: 101 mg/dL — ABNORMAL HIGH (ref 0–99)
NonHDL: 117.55
Total CHOL/HDL Ratio: 3
Triglycerides: 85 mg/dL (ref 0.0–149.0)
VLDL: 17 mg/dL (ref 0.0–40.0)

## 2019-06-21 LAB — BASIC METABOLIC PANEL
BUN: 14 mg/dL (ref 6–23)
CO2: 26 mEq/L (ref 19–32)
Calcium: 8.5 mg/dL (ref 8.4–10.5)
Chloride: 105 mEq/L (ref 96–112)
Creatinine, Ser: 0.67 mg/dL (ref 0.40–1.20)
GFR: 88.62 mL/min (ref >60.00)
Glucose, Bld: 96 mg/dL (ref 70–99)
Potassium: 3.8 mEq/L (ref 3.5–5.1)
Sodium: 138 mEq/L (ref 135–145)

## 2019-06-21 LAB — HEPATIC FUNCTION PANEL
ALT: 21 U/L (ref 0–35)
AST: 18 U/L (ref 0–37)
Albumin: 3.8 g/dL (ref 3.5–5.2)
Alkaline Phosphatase: 74 U/L (ref 39–117)
Bilirubin, Direct: 0.1 mg/dL (ref 0.0–0.3)
Total Bilirubin: 0.5 mg/dL (ref 0.2–1.2)
Total Protein: 6.1 g/dL (ref 6.0–8.3)

## 2019-06-21 LAB — TSH: TSH: 1.91 u[IU]/mL (ref 0.35–4.50)

## 2019-06-21 LAB — VITAMIN D 25 HYDROXY (VIT D DEFICIENCY, FRACTURES): VITD: 34.46 ng/mL (ref 30.00–100.00)

## 2019-07-30 ENCOUNTER — Encounter: Payer: Self-pay | Admitting: Family Medicine

## 2019-07-30 ENCOUNTER — Ambulatory Visit (INDEPENDENT_AMBULATORY_CARE_PROVIDER_SITE_OTHER): Payer: BC Managed Care – PPO | Admitting: Family Medicine

## 2019-07-30 ENCOUNTER — Other Ambulatory Visit: Payer: Self-pay

## 2019-07-30 VITALS — Ht 64.75 in | Wt 188.0 lb

## 2019-07-30 DIAGNOSIS — R198 Other specified symptoms and signs involving the digestive system and abdomen: Secondary | ICD-10-CM

## 2019-07-30 DIAGNOSIS — K219 Gastro-esophageal reflux disease without esophagitis: Secondary | ICD-10-CM

## 2019-07-30 MED ORDER — DICYCLOMINE HCL 20 MG PO TABS: 20.0000 mg | ORAL_TABLET | Freq: Three times a day (TID) | ORAL | 1 refills | Status: DC | PRN

## 2019-07-30 NOTE — Progress Notes (Signed)
I have discussed the procedure for the virtual visit with the patient who has given consent to proceed with assessment and treatment.   Pt states that she is not able to drink water as often as she did before, has had poor diet choices lately, is still taking her iron supplement .   Davis Gourd, CMA

## 2019-07-30 NOTE — Progress Notes (Signed)
Virtual Visit via Video   I connected with patient on 07/30/19 at  9:00 AM EDT by a video enabled telemedicine application and verified that I am speaking with the correct person using two identifiers.  Location patient: Home Location provider: Acupuncturist, Office Persons participating in the virtual visit: Patient, Provider, [place] (Jess B)  I discussed the limitations of evaluation and management by telemedicine and the availability of in person appointments. The patient expressed understanding and agreed to proceed.  Subjective:   HPI:   Irregular BMs- pt reports BMs 'have been not quite normal' in the past month.  At times 'they are dark and harder to go'.  Will occasionally have diarrhea.  Currently on daily iron supplement.  Denies increased stress recently.  Pt reports diet is not healthy.  Pt is walking 5x/week for 30 minutes.  At times will have abd cramping/pain.  Pt has difficult time drinking water during the day.  UTD on colonoscopy  GERD- increased sxs recently.  'i'm not eating right'.  Has not been taking Pantoprazole 'for about a year'.  ROS:   See pertinent positives and negatives per HPI.  Patient Active Problem List   Diagnosis Date Noted  . Obesity (BMI 30-39.9) 06/13/2019  . Metatarsalgia, right foot 07/04/2018  . Hallux rigidus, right foot 07/04/2018  . Eustachian tube dysfunction, bilateral 10/02/2017  . Ganglion cyst of dorsum of right wrist 03/27/2017  . Vitamin D deficiency 09/28/2016  . Urinary incontinence, mixed 03/25/2015  . Left hip pain 03/25/2015  . Abdominal bloating 06/05/2014  . Postmenopausal HRT (hormone replacement therapy) 07/24/2013  . Routine general medical examination at a health care facility 09/19/2012  . COLONIC POLYPS, ADENOMATOUS, HX OF 06/29/2010  . PALPITATIONS, HX OF 06/12/2009  . HALLUX RIGIDUS, ACQUIRED 06/03/2008  . PREDIABETES 06/02/2008  . Hyperlipidemia 03/28/2008  . Essential hypertension 03/28/2008  . GERD  03/28/2008  . OSTEOARTHRITIS 03/28/2008  . PULMONARY NODULE 10/03/2005    Social History   Tobacco Use  . Smoking status: Former Smoker    Quit date: 05/25/1987    Years since quitting: 32.2  . Smokeless tobacco: Never Used  Substance Use Topics  . Alcohol use: Yes    Alcohol/week: 12.0 standard drinks    Types: 12 Glasses of wine per week    Comment: 1-2 beers daily    Current Outpatient Medications:  .  Ascorbic Acid (VITAMIN C PO), Take 1 capsule by mouth daily., Disp: , Rfl:  .  aspirin 81 MG tablet, Take 81 mg by mouth daily., Disp: , Rfl:  .  atorvastatin (LIPITOR) 20 MG tablet, TAKE 1 TABLET BY MOUTH DAILY, Disp: 90 tablet, Rfl: 0 .  Cholecalciferol (VITAMIN D) 2000 units tablet, Take 2,000 Units by mouth daily., Disp: , Rfl:  .  clotrimazole-betamethasone (LOTRISONE) cream, APPLY EXTERNALLY TO THE AFFECTED AREA TWICE DAILY, Disp: 30 g, Rfl: 0 .  Cyanocobalamin (VITAMIN B-12 PO), Take 1 tablet by mouth daily.  , Disp: , Rfl:  .  estradiol (ESTRACE) 0.5 MG tablet, TAKE 1 TABLET(0.5 MG) BY MOUTH DAILY, Disp: 90 tablet, Rfl: 0 .  ferrous sulfate 325 (65 FE) MG tablet, Take 325 mg by mouth daily with breakfast.  , Disp: , Rfl:  .  fluticasone (FLONASE) 50 MCG/ACT nasal spray, SHAKE LIQUID AND USE 2 SPRAYS IN EACH NOSTRIL DAILY, Disp: 16 g, Rfl: 6 .  Glucosamine-Chondroit-Vit C-Mn CAPS, Take 2 capsules by mouth daily. Takes two  daily , Disp: , Rfl:  .  KRILL  OIL PO, Take 321 mg by mouth., Disp: , Rfl:  .  Lysine HCl (L-FORMULA LYSINE HCL) 500 MG TABS, Take by mouth., Disp: , Rfl:  .  montelukast (SINGULAIR) 10 MG tablet, TAKE 1 TABLET(10 MG) BY MOUTH AT BEDTIME, Disp: 30 tablet, Rfl: 6 .  Multiple Vitamins-Minerals (MULTIVITAMIN WITH MINERALS) tablet, Take 1 tablet by mouth daily.  , Disp: , Rfl:  .  pantoprazole (PROTONIX) 40 MG tablet, Take 1 tablet (40 mg total) by mouth daily. (Patient not taking: Reported on 07/30/2019), Disp: 90 tablet, Rfl: 0  Allergies  Allergen Reactions    . Lisinopril Cough  . Naproxen Nausea Only    Objective:   Ht 5' 4.75" (1.645 m)   Wt 188 lb (85.3 kg)   BMI 31.53 kg/m   AAOx3, NAD NCAT, EOMI No obvious CN deficits Coloring WNL Pt is able to speak clearly, coherently without shortness of breath or increased work of breathing.  Thought process is linear.  Mood is appropriate.   Assessment and Plan:   GERD- deteriorated.  Pt reports she has not been eating well, has gained weight, and has not been taking PPI for the last year.  Restart Protonix (she has this available at home).  Also discussed lifestyle modifications that will improve sxs.  Pt expressed understanding and is in agreement w/ plan.   Alternating diarrhea/constipation- new.  Suspect this is due to poor dietary habits, continued use of iron supplement, lack of water intake, and increased stress (son and daughter-in-law have COVID).  Sxs consistent w/ IBS but will consider GI referral if sxs don't improve.  Start Bentyl prn.  Encouraged increased water intake, less fat and processed foods, continue her regular exercise.  Pt expressed understanding and is in agreement w/ plan.    Annye Asa, MD 07/30/2019

## 2019-08-01 ENCOUNTER — Other Ambulatory Visit: Payer: Self-pay | Admitting: General Practice

## 2019-08-01 MED ORDER — ESTRADIOL 0.5 MG PO TABS: ORAL_TABLET | ORAL | 0 refills | Status: DC

## 2019-08-07 ENCOUNTER — Other Ambulatory Visit: Payer: Self-pay | Admitting: General Practice

## 2019-08-07 MED ORDER — PANTOPRAZOLE SODIUM 40 MG PO TBEC: 40.0000 mg | DELAYED_RELEASE_TABLET | Freq: Every day | ORAL | 1 refills | Status: DC

## 2019-08-19 ENCOUNTER — Telehealth (INDEPENDENT_AMBULATORY_CARE_PROVIDER_SITE_OTHER): Payer: BC Managed Care – PPO | Admitting: Family Medicine

## 2019-08-19 ENCOUNTER — Encounter: Payer: Self-pay | Admitting: Family Medicine

## 2019-08-19 ENCOUNTER — Other Ambulatory Visit: Payer: Self-pay

## 2019-08-19 DIAGNOSIS — K219 Gastro-esophageal reflux disease without esophagitis: Secondary | ICD-10-CM | POA: Diagnosis not present

## 2019-08-19 DIAGNOSIS — R198 Other specified symptoms and signs involving the digestive system and abdomen: Secondary | ICD-10-CM | POA: Diagnosis not present

## 2019-08-19 NOTE — Progress Notes (Signed)
Virtual Visit via Video   I connected with patient on 08/19/19 at  2:00 PM EDT by a video enabled telemedicine application and verified that I am speaking with the correct person using two identifiers.  Location patient: Home Location provider: Acupuncturist, Office Persons participating in the virtual visit: Patient, Provider, [place] (Jess B)  I discussed the limitations of evaluation and management by telemedicine and the availability of in person appointments. The patient expressed understanding and agreed to proceed.  Subjective:   HPI:  Bowel Changes- pt reports her alternating diarrhea and constipation has improved since last visit.  Has required minimal bentyl since last visit and feels that overall, things are doing better.  She is more comfortable.  GERD- sxs have improved since restarting Pantoprazole.  She is hoping to wean off this again in the future.  ROS:   See pertinent positives and negatives per HPI.  Patient Active Problem List   Diagnosis Date Noted  . Obesity (BMI 30-39.9) 06/13/2019  . Metatarsalgia, right foot 07/04/2018  . Hallux rigidus, right foot 07/04/2018  . Eustachian tube dysfunction, bilateral 10/02/2017  . Ganglion cyst of dorsum of right wrist 03/27/2017  . Vitamin D deficiency 09/28/2016  . Urinary incontinence, mixed 03/25/2015  . Left hip pain 03/25/2015  . Abdominal bloating 06/05/2014  . Postmenopausal HRT (hormone replacement therapy) 07/24/2013  . Routine general medical examination at a health care facility 09/19/2012  . COLONIC POLYPS, ADENOMATOUS, HX OF 06/29/2010  . PALPITATIONS, HX OF 06/12/2009  . HALLUX RIGIDUS, ACQUIRED 06/03/2008  . PREDIABETES 06/02/2008  . Hyperlipidemia 03/28/2008  . Essential hypertension 03/28/2008  . GERD 03/28/2008  . OSTEOARTHRITIS 03/28/2008  . PULMONARY NODULE 10/03/2005    Social History   Tobacco Use  . Smoking status: Former Smoker    Quit date: 05/25/1987    Years since quitting:  32.2  . Smokeless tobacco: Never Used  Substance Use Topics  . Alcohol use: Yes    Alcohol/week: 12.0 standard drinks    Types: 12 Glasses of wine per week    Comment: 1-2 beers daily    Current Outpatient Medications:  .  Ascorbic Acid (VITAMIN C PO), Take 1 capsule by mouth daily., Disp: , Rfl:  .  aspirin 81 MG tablet, Take 81 mg by mouth daily., Disp: , Rfl:  .  atorvastatin (LIPITOR) 20 MG tablet, TAKE 1 TABLET BY MOUTH DAILY, Disp: 90 tablet, Rfl: 0 .  Cholecalciferol (VITAMIN D) 2000 units tablet, Take 2,000 Units by mouth daily., Disp: , Rfl:  .  clotrimazole-betamethasone (LOTRISONE) cream, APPLY EXTERNALLY TO THE AFFECTED AREA TWICE DAILY, Disp: 30 g, Rfl: 0 .  Cyanocobalamin (VITAMIN B-12 PO), Take 1 tablet by mouth daily.  , Disp: , Rfl:  .  estradiol (ESTRACE) 0.5 MG tablet, TAKE 1 TABLET(0.5 MG) BY MOUTH DAILY, Disp: 90 tablet, Rfl: 0 .  ferrous sulfate 325 (65 FE) MG tablet, Take 325 mg by mouth daily with breakfast.  , Disp: , Rfl:  .  fluticasone (FLONASE) 50 MCG/ACT nasal spray, SHAKE LIQUID AND USE 2 SPRAYS IN EACH NOSTRIL DAILY, Disp: 16 g, Rfl: 6 .  Glucosamine-Chondroit-Vit C-Mn CAPS, Take 2 capsules by mouth daily. Takes two  daily , Disp: , Rfl:  .  KRILL OIL PO, Take 321 mg by mouth., Disp: , Rfl:  .  Lysine HCl (L-FORMULA LYSINE HCL) 500 MG TABS, Take by mouth., Disp: , Rfl:  .  montelukast (SINGULAIR) 10 MG tablet, TAKE 1 TABLET(10 MG) BY MOUTH AT  BEDTIME, Disp: 30 tablet, Rfl: 6 .  Multiple Vitamins-Minerals (MULTIVITAMIN WITH MINERALS) tablet, Take 1 tablet by mouth daily.  , Disp: , Rfl:  .  pantoprazole (PROTONIX) 40 MG tablet, Take 1 tablet (40 mg total) by mouth daily., Disp: 90 tablet, Rfl: 1 .  dicyclomine (BENTYL) 20 MG tablet, Take 1 tablet (20 mg total) by mouth 3 (three) times daily as needed for spasms. (Patient not taking: Reported on 08/19/2019), Disp: 90 tablet, Rfl: 1  Allergies  Allergen Reactions  . Lisinopril Cough  . Naproxen Nausea Only     Objective:   There were no vitals taken for this visit. AAOx3, NAD NCAT, EOMI No obvious CN deficits Coloring WNL Pt is able to speak clearly, coherently without shortness of breath or increased work of breathing.  Thought process is linear.  Mood is appropriate.   Assessment and Plan:   GERD- improved since restarting Pantoprazole.  Discussed that she is able to stop medication when sxs are well controlled and restart it as needed.  Pt expressed understanding and is in agreement w/ plan.   Alternating diarrhea/constipation- suspect that pt has some mild underlying IBS.  sxs are improved since last visit and she has required minimal Bentyl.  She was able to incorporate lifestyle changes w/ good success.  No further workup needed at this time.  Will follow.   Annye Asa, MD 08/19/2019

## 2019-08-19 NOTE — Progress Notes (Signed)
I have discussed the procedure for the virtual visit with the patient who has given consent to proceed with assessment and treatment.   Pt unable to obtain vitals.   Bianca Hogan, CMA     

## 2019-09-09 ENCOUNTER — Other Ambulatory Visit: Payer: Self-pay | Admitting: Emergency Medicine

## 2019-09-09 DIAGNOSIS — E785 Hyperlipidemia, unspecified: Secondary | ICD-10-CM

## 2019-09-09 MED ORDER — ATORVASTATIN CALCIUM 20 MG PO TABS: 20.0000 mg | ORAL_TABLET | Freq: Every day | ORAL | 1 refills | Status: DC

## 2019-09-16 ENCOUNTER — Other Ambulatory Visit: Payer: Self-pay | Admitting: General Practice

## 2019-09-16 MED ORDER — ESTRADIOL 0.5 MG PO TABS: ORAL_TABLET | ORAL | 0 refills | Status: DC

## 2019-09-17 ENCOUNTER — Telehealth: Payer: Self-pay | Admitting: Family Medicine

## 2019-09-17 MED ORDER — LOSARTAN POTASSIUM 50 MG PO TABS: 50.0000 mg | ORAL_TABLET | Freq: Every day | ORAL | 1 refills | Status: DC

## 2019-09-17 NOTE — Telephone Encounter (Signed)
Ok to restart Losartan 50mg  daily, #90, 1 refill

## 2019-09-17 NOTE — Telephone Encounter (Signed)
Pt called in asking to go back on the Losartan 50 mg, please send a new script to the [facility]

## 2019-09-17 NOTE — Telephone Encounter (Signed)
Medication filled to pharmacy as requested.   

## 2019-09-17 NOTE — Telephone Encounter (Signed)
Please advise 

## 2019-11-22 ENCOUNTER — Other Ambulatory Visit: Payer: Self-pay | Admitting: Emergency Medicine

## 2019-11-22 MED ORDER — CLOTRIMAZOLE-BETAMETHASONE 1-0.05 % EX CREA: TOPICAL_CREAM | CUTANEOUS | 0 refills | Status: DC

## 2019-12-03 ENCOUNTER — Encounter: Payer: Self-pay | Admitting: Family Medicine

## 2019-12-04 MED ORDER — FUROSEMIDE 20 MG PO TABS: 20.0000 mg | ORAL_TABLET | Freq: Every day | ORAL | 0 refills | Status: DC

## 2019-12-16 ENCOUNTER — Ambulatory Visit (INDEPENDENT_AMBULATORY_CARE_PROVIDER_SITE_OTHER): Payer: BC Managed Care – PPO | Admitting: Family Medicine

## 2019-12-16 ENCOUNTER — Encounter: Payer: Self-pay | Admitting: Family Medicine

## 2019-12-16 ENCOUNTER — Other Ambulatory Visit: Payer: Self-pay

## 2019-12-16 VITALS — BP 123/83 | HR 86 | Temp 97.6°F | Resp 16 | Ht 65.0 in | Wt 198.2 lb

## 2019-12-16 DIAGNOSIS — E669 Obesity, unspecified: Secondary | ICD-10-CM

## 2019-12-16 DIAGNOSIS — E785 Hyperlipidemia, unspecified: Secondary | ICD-10-CM | POA: Diagnosis not present

## 2019-12-16 DIAGNOSIS — I10 Essential (primary) hypertension: Secondary | ICD-10-CM | POA: Diagnosis not present

## 2019-12-16 LAB — CBC WITH DIFFERENTIAL/PLATELET
Basophils Absolute: 0 10*3/uL (ref 0.0–0.1)
Basophils Relative: 0.6 % (ref 0.0–3.0)
Eosinophils Absolute: 0.2 10*3/uL (ref 0.0–0.7)
Eosinophils Relative: 2.6 % (ref 0.0–5.0)
HCT: 41.7 % (ref 36.0–46.0)
Hemoglobin: 14.4 g/dL (ref 12.0–15.0)
Lymphocytes Relative: 24.7 % (ref 12.0–46.0)
Lymphs Abs: 1.5 10*3/uL (ref 0.7–4.0)
MCHC: 34.4 g/dL (ref 30.0–36.0)
MCV: 93.9 fl (ref 78.0–100.0)
Monocytes Absolute: 0.6 10*3/uL (ref 0.1–1.0)
Monocytes Relative: 9.5 % (ref 3.0–12.0)
Neutro Abs: 3.7 10*3/uL (ref 1.4–7.7)
Neutrophils Relative %: 62.6 % (ref 43.0–77.0)
Platelets: 236 10*3/uL (ref 150.0–400.0)
RBC: 4.44 Mil/uL (ref 3.87–5.11)
RDW: 13 % (ref 11.5–15.5)
WBC: 6 10*3/uL (ref 4.0–10.5)

## 2019-12-16 LAB — LIPID PANEL
Cholesterol: 233 mg/dL — ABNORMAL HIGH (ref 0–200)
HDL: 75.3 mg/dL (ref >39.00)
LDL Cholesterol: 141 mg/dL — ABNORMAL HIGH (ref 0–99)
NonHDL: 158.19
Total CHOL/HDL Ratio: 3
Triglycerides: 84 mg/dL (ref 0.0–149.0)
VLDL: 16.8 mg/dL (ref 0.0–40.0)

## 2019-12-16 LAB — BASIC METABOLIC PANEL
BUN: 17 mg/dL (ref 6–23)
CO2: 26 mEq/L (ref 19–32)
Calcium: 9.1 mg/dL (ref 8.4–10.5)
Chloride: 105 mEq/L (ref 96–112)
Creatinine, Ser: 0.69 mg/dL (ref 0.40–1.20)
GFR: 85.53 mL/min (ref >60.00)
Glucose, Bld: 100 mg/dL — ABNORMAL HIGH (ref 70–99)
Potassium: 4.2 mEq/L (ref 3.5–5.1)
Sodium: 139 mEq/L (ref 135–145)

## 2019-12-16 LAB — HEPATIC FUNCTION PANEL
ALT: 22 U/L (ref 0–35)
AST: 19 U/L (ref 0–37)
Albumin: 4 g/dL (ref 3.5–5.2)
Alkaline Phosphatase: 79 U/L (ref 39–117)
Bilirubin, Direct: 0.1 mg/dL (ref 0.0–0.3)
Total Bilirubin: 0.4 mg/dL (ref 0.2–1.2)
Total Protein: 6.5 g/dL (ref 6.0–8.3)

## 2019-12-16 LAB — TSH: TSH: 1.53 u[IU]/mL (ref 0.35–4.50)

## 2019-12-16 NOTE — Progress Notes (Signed)
   Subjective:    Patient ID: Bianca Hogan, female    DOB: 1955-10-23, 64 y.o.   MRN: [id number]  HPI Hyperlipidemia- chronic problem, on Lipitor 20mg  daily.  No abd pain, N/V.  HTN- chronic problem, on Losartan 50mg  daily w/ good control.  No CP, SOB, HAs, visual changes.  Obesity- pt has gained 10 lbs since March.  Pt just got back from vacation.  Pt typically walks 4-5x/week.  [facility]   Review of Systems For ROS see HPI   This visit occurred during the SARS-CoV-2 public health emergency.  Safety protocols were in place, including screening questions prior to the visit, additional usage of staff PPE, and extensive cleaning of exam room while observing appropriate contact time as indicated for disinfecting solutions.       Objective:   Physical Exam Vitals reviewed.  Constitutional:      General: She is not in acute distress.    Appearance: Normal appearance. She is well-developed. She is obese.  HENT:     Head: Normocephalic and atraumatic.  Eyes:     Conjunctiva/sclera: Conjunctivae normal.     Pupils: Pupils are equal, round, and reactive to light.  Neck:     Thyroid: No thyromegaly.  Cardiovascular:     Rate and Rhythm: Normal rate and regular rhythm.     Heart sounds: Normal heart sounds. No murmur heard.   Pulmonary:     Effort: Pulmonary effort is normal. No respiratory distress.     Breath sounds: Normal breath sounds.  Abdominal:     General: There is no distension.     Palpations: Abdomen is soft.     Tenderness: There is no abdominal tenderness.  Musculoskeletal:     Cervical back: Normal range of motion and neck supple.  Lymphadenopathy:     Cervical: No cervical adenopathy.  Skin:    General: Skin is warm and dry.  Neurological:     Mental Status: She is alert and oriented to person, place, and time.  Psychiatric:        Behavior: Behavior normal.           Assessment & Plan:

## 2019-12-16 NOTE — Assessment & Plan Note (Signed)
Pt has gained 10 lbs since last visit but is aware of this and intends to work on it.  [facility] and resume her regular walking w/ friends.  Also encouraged healthy diet.  Will follow.

## 2019-12-16 NOTE — Patient Instructions (Signed)
Schedule your complete physical in 6 months We'll notify you of your lab results and make any changes if needed Continue to work on healthy diet and regular exercise- you can do it! Call with any questions or concerns Stay Safe!  Stay Healthy! 

## 2019-12-16 NOTE — Assessment & Plan Note (Signed)
Chronic problem.  Well controlled.  Asymptomatic.  Check labs.  No anticipated med changes.  Will follow. 

## 2019-12-16 NOTE — Assessment & Plan Note (Signed)
Chronic problem.  Tolerating statin w/o difficulty.  Check labs.  Adjust meds prn  

## 2020-01-06 ENCOUNTER — Other Ambulatory Visit: Payer: Self-pay | Admitting: General Practice

## 2020-01-06 MED ORDER — MONTELUKAST SODIUM 10 MG PO TABS: ORAL_TABLET | ORAL | 6 refills | Status: DC

## 2020-01-21 ENCOUNTER — Telehealth: Payer: Self-pay | Admitting: Family Medicine

## 2020-01-21 NOTE — Telephone Encounter (Signed)
Note made in error - patient decided to make an appointment

## 2020-01-21 NOTE — Telephone Encounter (Signed)
Encounter made in error - Patient decided to make an appointment

## 2020-01-24 ENCOUNTER — Ambulatory Visit (INDEPENDENT_AMBULATORY_CARE_PROVIDER_SITE_OTHER): Payer: BC Managed Care – PPO | Admitting: Family Medicine

## 2020-01-24 ENCOUNTER — Other Ambulatory Visit: Payer: Self-pay

## 2020-01-24 ENCOUNTER — Encounter: Payer: Self-pay | Admitting: Family Medicine

## 2020-01-24 VITALS — BP 122/80 | HR 82 | Temp 98.0°F | Resp 16 | Ht 65.0 in | Wt 199.2 lb

## 2020-01-24 DIAGNOSIS — R2232 Localized swelling, mass and lump, left upper limb: Secondary | ICD-10-CM

## 2020-01-24 DIAGNOSIS — Z23 Encounter for immunization: Secondary | ICD-10-CM | POA: Diagnosis not present

## 2020-01-24 NOTE — Patient Instructions (Signed)
Follow up as needed or as scheduled [facility] for [facility] next available Call with any questions or concerns Stay Safe!  Stay Healthy!

## 2020-01-24 NOTE — Progress Notes (Signed)
   Subjective:    Patient ID: Bianca Hogan, female    DOB: 01-Nov-1955, 64 y.o.   MRN: [id number]  HPI Lump in armpit- L armpit, noticed while shaving 'a couple of weeks ago'.  Not painful.  No obvious redness or drainage.  No similar areas on R side.  Pt feels it is unchanged in size since discovery.   Review of Systems For ROS see HPI   This visit occurred during the SARS-CoV-2 public health emergency.  Safety protocols were in place, including screening questions prior to the visit, additional usage of staff PPE, and extensive cleaning of exam room while observing appropriate contact time as indicated for disinfecting solutions.       Objective:   Physical Exam Vitals reviewed.  Constitutional:      General: She is not in acute distress.    Appearance: Normal appearance.  HENT:     Head: Normocephalic and atraumatic.  Chest:     Breasts:        Left: Mass (freely mobile mass in L axilla ~4-5 cm) present. No swelling, bleeding, inverted nipple, nipple discharge, skin change or tenderness.    Lymphadenopathy:     Upper Body:     Left upper body: No supraclavicular, axillary or pectoral adenopathy.  Neurological:     General: No focal deficit present.     Mental Status: She is alert and oriented to person, place, and time.  Psychiatric:        Mood and Affect: Mood normal.        Behavior: Behavior normal.        Thought Content: Thought content normal.           Assessment & Plan:  Axillary mass- new.  Pt noticed a few weeks ago.  Freely mobile, non-tender.  Not subcutaneous or superficial. UTD on mammo (Jan 2021).  Will get diagnostic mammo and axillary [place].  Pt expressed understanding and is in agreement w/ plan.

## 2020-02-05 ENCOUNTER — Ambulatory Visit
Admission: RE | Admit: 2020-02-05 | Discharge: 2020-02-05 | Disposition: A | Discharge status: Home / Self Care | Payer: BC Managed Care – PPO | Source: Ambulatory Visit | Attending: Family Medicine | Admitting: Family Medicine

## 2020-02-05 ENCOUNTER — Other Ambulatory Visit: Payer: Self-pay | Admitting: Family Medicine

## 2020-02-05 ENCOUNTER — Other Ambulatory Visit: Payer: Self-pay

## 2020-02-05 DIAGNOSIS — R2232 Localized swelling, mass and lump, left upper limb: Secondary | ICD-10-CM

## 2020-02-16 ENCOUNTER — Other Ambulatory Visit: Payer: Self-pay | Admitting: Family Medicine

## 2020-02-27 ENCOUNTER — Other Ambulatory Visit: Payer: Self-pay | Admitting: General Practice

## 2020-02-27 MED ORDER — ESTRADIOL 0.5 MG PO TABS: ORAL_TABLET | ORAL | 0 refills | Status: DC

## 2020-05-03 ENCOUNTER — Other Ambulatory Visit: Payer: Self-pay | Admitting: Family Medicine

## 2020-05-03 DIAGNOSIS — E785 Hyperlipidemia, unspecified: Secondary | ICD-10-CM

## 2020-05-05 ENCOUNTER — Other Ambulatory Visit: Payer: Self-pay | Admitting: Family Medicine

## 2020-05-21 ENCOUNTER — Other Ambulatory Visit: Payer: Self-pay | Admitting: Family Medicine

## 2020-05-21 DIAGNOSIS — Z1231 Encounter for screening mammogram for malignant neoplasm of breast: Secondary | ICD-10-CM

## 2020-05-25 ENCOUNTER — Ambulatory Visit
Admission: RE | Admit: 2020-05-25 | Discharge: 2020-05-25 | Disposition: A | Discharge status: Home / Self Care | Payer: BC Managed Care – PPO | Source: Ambulatory Visit

## 2020-05-25 ENCOUNTER — Other Ambulatory Visit: Payer: Self-pay

## 2020-05-25 DIAGNOSIS — Z1231 Encounter for screening mammogram for malignant neoplasm of breast: Secondary | ICD-10-CM

## 2020-06-08 ENCOUNTER — Other Ambulatory Visit: Payer: Self-pay | Admitting: Family Medicine

## 2020-06-17 ENCOUNTER — Encounter: Payer: BC Managed Care – PPO | Admitting: Family Medicine

## 2020-07-10 ENCOUNTER — Encounter: Payer: Self-pay | Admitting: Family Medicine

## 2020-07-10 ENCOUNTER — Other Ambulatory Visit: Payer: Self-pay

## 2020-07-10 ENCOUNTER — Ambulatory Visit (INDEPENDENT_AMBULATORY_CARE_PROVIDER_SITE_OTHER): Payer: BC Managed Care – PPO | Admitting: Family Medicine

## 2020-07-10 VITALS — BP 130/80 | HR 61 | Temp 98.6°F | Resp 19 | Ht 64.0 in | Wt 197.0 lb

## 2020-07-10 DIAGNOSIS — I10 Essential (primary) hypertension: Secondary | ICD-10-CM | POA: Diagnosis not present

## 2020-07-10 DIAGNOSIS — E559 Vitamin D deficiency, unspecified: Secondary | ICD-10-CM

## 2020-07-10 DIAGNOSIS — Z23 Encounter for immunization: Secondary | ICD-10-CM

## 2020-07-10 DIAGNOSIS — Z0000 Encounter for general adult medical examination without abnormal findings: Secondary | ICD-10-CM | POA: Diagnosis not present

## 2020-07-10 LAB — BASIC METABOLIC PANEL
BUN: 13 mg/dL (ref 6–23)
CO2: 28 mEq/L (ref 19–32)
Calcium: 8.7 mg/dL (ref 8.4–10.5)
Chloride: 105 mEq/L (ref 96–112)
Creatinine, Ser: 0.67 mg/dL (ref 0.40–1.20)
GFR: 91.98 mL/min (ref >60.00)
Glucose, Bld: 88 mg/dL (ref 70–99)
Potassium: 3.8 mEq/L (ref 3.5–5.1)
Sodium: 138 mEq/L (ref 135–145)

## 2020-07-10 LAB — CBC WITH DIFFERENTIAL/PLATELET
Basophils Absolute: 0 10*3/uL (ref 0.0–0.1)
Basophils Relative: 0.3 % (ref 0.0–3.0)
Eosinophils Absolute: 0.1 10*3/uL (ref 0.0–0.7)
Eosinophils Relative: 2 % (ref 0.0–5.0)
HCT: 41.5 % (ref 36.0–46.0)
Hemoglobin: 14 g/dL (ref 12.0–15.0)
Lymphocytes Relative: 27 % (ref 12.0–46.0)
Lymphs Abs: 1.4 10*3/uL (ref 0.7–4.0)
MCHC: 33.7 g/dL (ref 30.0–36.0)
MCV: 93.1 fl (ref 78.0–100.0)
Monocytes Absolute: 0.4 10*3/uL (ref 0.1–1.0)
Monocytes Relative: 7.9 % (ref 3.0–12.0)
Neutro Abs: 3.3 10*3/uL (ref 1.4–7.7)
Neutrophils Relative %: 62.8 % (ref 43.0–77.0)
Platelets: 219 10*3/uL (ref 150.0–400.0)
RBC: 4.46 Mil/uL (ref 3.87–5.11)
RDW: 12.8 % (ref 11.5–15.5)
WBC: 5.2 10*3/uL (ref 4.0–10.5)

## 2020-07-10 LAB — LIPID PANEL
Cholesterol: 180 mg/dL (ref 0–200)
HDL: 66.5 mg/dL (ref >39.00)
LDL Cholesterol: 94 mg/dL (ref 0–99)
NonHDL: 113.81
Total CHOL/HDL Ratio: 3
Triglycerides: 98 mg/dL (ref 0.0–149.0)
VLDL: 19.6 mg/dL (ref 0.0–40.0)

## 2020-07-10 LAB — HEPATIC FUNCTION PANEL
ALT: 20 U/L (ref 0–35)
AST: 21 U/L (ref 0–37)
Albumin: 3.8 g/dL (ref 3.5–5.2)
Alkaline Phosphatase: 76 U/L (ref 39–117)
Bilirubin, Direct: 0.1 mg/dL (ref 0.0–0.3)
Total Bilirubin: 0.4 mg/dL (ref 0.2–1.2)
Total Protein: 6.5 g/dL (ref 6.0–8.3)

## 2020-07-10 LAB — TSH: TSH: 1.61 u[IU]/mL (ref 0.35–4.50)

## 2020-07-10 LAB — VITAMIN D 25 HYDROXY (VIT D DEFICIENCY, FRACTURES): VITD: 35.52 ng/mL (ref 30.00–100.00)

## 2020-07-10 NOTE — Progress Notes (Signed)
   Subjective:    Patient ID: Bianca Hogan, female    DOB: [birth year], 65 y.o.   MRN: [id number]  HPI CPE- UTD on mammo, colonoscopy (due later this year), Tdap, flu, COVID.  Due for Prevnar  Patient Care Team    Relationship Specialty Notifications Start End  Midge Minium, MD PCP - General Family Medicine  09/28/11   Irene Shipper, MD Consulting Physician Gastroenterology  09/22/14     Health Maintenance  Topic Date Due  . PNA vac Low Risk Adult (1 of 2 - PCV13) 07/04/2020  . HIV Screening  12/15/2020 (Originally [birth year]1970)  . COLONOSCOPY (Pts 45-7yrs Insurance coverage will need to be confirmed)  12/17/2020  . MAMMOGRAM  05/25/2021  . TETANUS/TDAP  01/23/2030  . INFLUENZA VACCINE  Completed  . DEXA SCAN  Completed  . COVID-19 Vaccine  Completed  . Hepatitis C Screening  Completed      Review of Systems Patient reports no vision/ hearing changes, adenopathy,fever, weight change,  persistant/recurrent hoarseness , swallowing issues, chest pain, edema, persistant/recurrent cough, hemoptysis, dyspnea (rest/exertional/paroxysmal nocturnal), gastrointestinal bleeding (melena, rectal bleeding), abdominal pain, significant heartburn, bowel changes, GU symptoms (dysuria, hematuria, incontinence), Gyn symptoms (abnormal  bleeding, pain),  syncope, focal weakness, memory loss, numbness & tingling, skin/hair/nail changes, abnormal bruising or bleeding, anxiety, or depression.   + episode of palpitations last week while driving- HR as high as 172.  Felt like she was going to pass out.    Objective:   Physical Exam General Appearance:    Alert, cooperative, no distress, appears stated age  Head:    Normocephalic, without obvious abnormality, atraumatic  Eyes:    PERRL, conjunctiva/corneas clear, EOM's intact, fundi    benign, both eyes  Ears:    Normal TM's and external ear canals, both ears  Nose:   Deferred due to COVID  Throat:   Neck:   Supple, symmetrical, trachea midline, no  adenopathy;    Thyroid: no enlargement/tenderness/nodules  Back:     Symmetric, no curvature, ROM normal, no CVA tenderness  Lungs:     Clear to auscultation bilaterally, respirations unlabored  Chest Wall:    No tenderness or deformity   Heart:    Regular rate and rhythm, S1 and S2 normal, no murmur, rub   or gallop  Breast Exam:    Deferred to mammo  Abdomen:     Soft, non-tender, bowel sounds active all four quadrants,    no masses, no organomegaly  Genitalia:    Deferred to GYN  Rectal:    Extremities:   Extremities normal, atraumatic, no cyanosis or edema  Pulses:   2+ and symmetric all extremities  Skin:   Skin color, texture, turgor normal, no rashes or lesions  Lymph nodes:   Cervical, supraclavicular, and axillary nodes normal  Neurologic:   CNII-XII intact, normal strength, sensation and reflexes    throughout          Assessment & Plan:

## 2020-07-10 NOTE — Assessment & Plan Note (Signed)
Pt has hx of similar.  Check labs.  Replete prn. 

## 2020-07-10 NOTE — Patient Instructions (Addendum)
Follow up in 6 months to recheck BP and cholesterol We'll notify you of your lab results and make any changes if needed Continue to work on healthy diet and regular exercise- you can do it! Make sure you are drinking plenty of fluids!!! Call with any questions or concerns [place]!!!!

## 2020-07-10 NOTE — Assessment & Plan Note (Signed)
Chronic problem.  Adequate control.  Currently asymptomatic w/ exception of vagal episode last week.  Check labs.  No anticipated med changes.  Will follow.

## 2020-07-10 NOTE — Addendum Note (Signed)
Addended by: Genevie Cheshire L on: 07/10/2020 08:58 AM   Modules accepted: Orders

## 2020-07-10 NOTE — Assessment & Plan Note (Signed)
Pt's PE WNL w/ exception of obesity.  UTD on mammo, colonoscopy, COVID, Tdap, flu.  Prevnar given today.  Check labs.  Anticipatory guidance provided.

## 2020-09-04 ENCOUNTER — Other Ambulatory Visit: Payer: Self-pay | Admitting: Family Medicine

## 2020-09-29 ENCOUNTER — Other Ambulatory Visit: Payer: Self-pay

## 2020-09-29 DIAGNOSIS — I10 Essential (primary) hypertension: Secondary | ICD-10-CM

## 2020-09-29 MED ORDER — FUROSEMIDE 20 MG PO TABS: 20.0000 mg | ORAL_TABLET | Freq: Every day | ORAL | 0 refills | Status: DC

## 2020-09-30 ENCOUNTER — Other Ambulatory Visit: Payer: Self-pay

## 2020-09-30 DIAGNOSIS — I10 Essential (primary) hypertension: Secondary | ICD-10-CM

## 2020-09-30 MED ORDER — FUROSEMIDE 20 MG PO TABS: 20.0000 mg | ORAL_TABLET | Freq: Every day | ORAL | 0 refills | Status: DC

## 2020-10-11 ENCOUNTER — Ambulatory Visit
Admission: EM | Admit: 2020-10-11 | Discharge: 2020-10-11 | Disposition: A | Discharge status: Home / Self Care | Payer: BC Managed Care – PPO | Attending: Emergency Medicine | Admitting: Emergency Medicine

## 2020-10-11 ENCOUNTER — Encounter: Payer: Self-pay | Admitting: Emergency Medicine

## 2020-10-11 ENCOUNTER — Other Ambulatory Visit: Payer: Self-pay

## 2020-10-11 DIAGNOSIS — J0100 Acute maxillary sinusitis, unspecified: Secondary | ICD-10-CM | POA: Diagnosis present

## 2020-10-11 DIAGNOSIS — R0981 Nasal congestion: Secondary | ICD-10-CM | POA: Diagnosis present

## 2020-10-11 LAB — POCT RAPID STREP A (OFFICE): Rapid Strep A Screen: NEGATIVE

## 2020-10-11 MED ORDER — FLUTICASONE PROPIONATE 50 MCG/ACT NA SUSP: 1.0000 | Freq: Every day | NASAL | 0 refills | Status: DC

## 2020-10-11 MED ORDER — AMOXICILLIN-POT CLAVULANATE 875-125 MG PO TABS: 1.0000 | ORAL_TABLET | Freq: Two times a day (BID) | ORAL | 0 refills | Status: DC

## 2020-10-11 MED ORDER — CETIRIZINE HCL 10 MG PO CAPS: 10.0000 mg | ORAL_CAPSULE | Freq: Every day | ORAL | 0 refills | Status: DC

## 2020-10-11 MED ORDER — PREDNISONE 20 MG PO TABS: 40.0000 mg | ORAL_TABLET | Freq: Every day | ORAL | 0 refills | Status: AC

## 2020-10-11 MED ORDER — BENZONATATE 200 MG PO CAPS: 200.0000 mg | ORAL_CAPSULE | Freq: Three times a day (TID) | ORAL | 0 refills | Status: AC | PRN

## 2020-10-11 NOTE — ED Triage Notes (Signed)
Pt said x 2 days has been having nasal congestion with drainage in the back of her throat with right ear pain with some popping. Pt said chills, no fevers.

## 2020-10-11 NOTE — ED Provider Notes (Signed)
[facility]    CSN: [id number] Arrival date & time: 10/11/20  0843      History   Chief Complaint Chief Complaint  Patient presents with  . Sore Throat  . Otalgia    HPI Bianca Hogan is a 65 y.o. female history of hypertension, hyperlipidemia, GERD presenting today for evaluation of sore throat and ear pressure.  Reports allergy symptoms with nasal congestion over the past few months, for the past 3 days has had worsening sinus congestion pressure ear pain and discomfort.  Denies any fevers, has had some chills.  Slight cough.  Has had throat irritation and hoarseness.  HPI  Past Medical History:  Diagnosis Date  . Allergy   . Bronchitis 01/23/2011  . GERD (gastroesophageal reflux disease)   . HLD (hyperlipidemia)   . Hypertension   . Osteoarthritis   . Shortness of breath    dyspenea; echo 12/10 EF 55-60%, nml; myoview 12/10 EF 83%, nml    Patient Active Problem List   Diagnosis Date Noted  . Obesity (BMI 30-39.9) 06/13/2019  . Metatarsalgia, right foot 07/04/2018  . Hallux rigidus, right foot 07/04/2018  . Eustachian tube dysfunction, bilateral 10/02/2017  . Ganglion cyst of dorsum of right wrist 03/27/2017  . Vitamin D deficiency 09/28/2016  . Urinary incontinence, mixed 03/25/2015  . Left hip pain 03/25/2015  . Abdominal bloating 06/05/2014  . Postmenopausal HRT (hormone replacement therapy) 07/24/2013  . Routine general medical examination at a health care facility 09/19/2012  . COLONIC POLYPS, ADENOMATOUS, HX OF 06/29/2010  . PALPITATIONS, HX OF 06/12/2009  . HALLUX RIGIDUS, ACQUIRED 06/03/2008  . PREDIABETES 06/02/2008  . Hyperlipidemia 03/28/2008  . Essential hypertension 03/28/2008  . GERD 03/28/2008  . OSTEOARTHRITIS 03/28/2008  . PULMONARY NODULE 10/03/2005    Past Surgical History:  Procedure Laterality Date  . BLADDER SUSPENSION  12/08  . chest CT  7/08   pulmonary nodule Rt middle   . COLONOSCOPY    . CYSTOCELE REPAIR    .  DILATION AND CURETTAGE OF UTERUS  1986   miscarriage   . POLYPECTOMY    . TONSILLECTOMY    . VAGINAL HYSTERECTOMY     no cervix    OB History   No obstetric history on file.      Home Medications    Prior to Admission medications   Medication Sig Start Date End Date Taking? Authorizing Provider  amoxicillin-clavulanate (AUGMENTIN) 875-125 MG tablet Take 1 tablet by mouth every 12 (twelve) hours. 10/11/20  Yes Mikael Debell C, PA-C  benzonatate (TESSALON) 200 MG capsule Take 1 capsule (200 mg total) by mouth 3 (three) times daily as needed for up to 7 days for cough. 10/11/20 10/18/20 Yes Thurmond Hildebran C, PA-C  Cetirizine HCl 10 MG CAPS Take 1 capsule (10 mg total) by mouth daily. 10/11/20  Yes Charlaine Utsey C, PA-C  fluticasone (FLONASE) 50 MCG/ACT nasal spray Place 1-2 sprays into both nostrils daily. 10/11/20  Yes Aryannah Mohon C, PA-C  predniSONE (DELTASONE) 20 MG tablet Take 2 tablets (40 mg total) by mouth daily for 5 days. 10/11/20 10/16/20 Yes Sahil Milner C, PA-C  Ascorbic Acid (VITAMIN C PO) Take 1 capsule by mouth daily.    [provider]  aspirin 81 MG tablet Take 81 mg by mouth daily.    [provider]  atorvastatin (LIPITOR) 20 MG tablet TAKE 1 TABLET(20 MG) BY MOUTH DAILY 05/04/20   Midge Minium, MD  Cholecalciferol (VITAMIN D) 2000 units tablet Take  2,000 Units by mouth daily.    [provider]  clotrimazole-betamethasone (LOTRISONE) cream APPLY EXTERNALLY TO THE AFFECTED AREA TWICE DAILY 11/22/19   Midge Minium, MD  Cyanocobalamin (VITAMIN B-12 PO) Take 1 tablet by mouth daily.    [provider]  dicyclomine (BENTYL) 20 MG tablet Take 1 tablet (20 mg total) by mouth 3 (three) times daily as needed for spasms. 07/30/19   Midge Minium, MD  estradiol (ESTRACE) 0.5 MG tablet TAKE 1 TABLET(0.5 MG) BY MOUTH DAILY 06/08/20   Midge Minium, MD  ferrous sulfate 325 (65 FE) MG tablet Take 325 mg by mouth daily with  breakfast.    [provider]  furosemide (LASIX) 20 MG tablet Take 1 tablet (20 mg total) by mouth daily. 09/30/20   Midge Minium, MD  Glucosamine-Chondroit-Vit C-Mn CAPS Take 2 capsules by mouth daily. Takes two  daily    [provider]  KRILL OIL PO Take 321 mg by mouth.    [provider]  losartan (COZAAR) 50 MG tablet TAKE 1 TABLET(50 MG) BY MOUTH DAILY 05/05/20   Midge Minium, MD  Lysine HCl (L-FORMULA LYSINE HCL) 500 MG TABS Take by mouth.    [provider]  montelukast (SINGULAIR) 10 MG tablet TAKE 1 TABLET(10 MG) BY MOUTH AT BEDTIME 01/06/20   Midge Minium, MD  Multiple Vitamins-Minerals (MULTIVITAMIN WITH MINERALS) tablet Take 1 tablet by mouth daily.    [provider]  pantoprazole (PROTONIX) 40 MG tablet TAKE 1 TABLET(40 MG) BY MOUTH DAILY 09/04/20   Midge Minium, MD    Family History Family History  Problem Relation Age of Onset  . Heart disease Father   . Diabetes Mother   . Heart disease Mother   . Diabetes Sister   . Heart disease Sister   . Diabetes Sister   . Heart disease Sister   . Heart disease Maternal Grandmother   . Heart disease Maternal Grandfather   . Heart disease Paternal Grandmother   . Heart disease Paternal Grandfather   . Cancer Neg Hx   . Breast cancer Neg Hx     Social History Social History   Tobacco Use  . Smoking status: Former Smoker    Quit date: 05/25/1987    Years since quitting: 33.4  . Smokeless tobacco: Never Used  Vaping Use  . Vaping Use: Never used  Substance Use Topics  . Alcohol use: Yes    Alcohol/week: 12.0 standard drinks    Types: 12 Glasses of wine per week    Comment: 1-2 beers daily  . Drug use: No     Allergies   Lisinopril and Naproxen   Review of Systems Review of Systems  Constitutional: Negative for activity change, appetite change, chills, fatigue and fever.  HENT: Positive for congestion, ear pain and sore throat. Negative for  rhinorrhea, sinus pressure and trouble swallowing.   Eyes: Negative for discharge and redness.  Respiratory: Negative for cough, chest tightness and shortness of breath.   Cardiovascular: Negative for chest pain.  Gastrointestinal: Negative for abdominal pain, diarrhea, nausea and vomiting.  Musculoskeletal: Negative for myalgias.  Skin: Negative for rash.  Neurological: Negative for dizziness, light-headedness and headaches.     Physical Exam Triage Vital Signs ED Triage Vitals  Enc Vitals Group     BP      Pulse      Resp      Temp      Temp src  SpO2      Weight      Height      Head Circumference      Peak Flow      Pain Score      Pain Loc      Pain Edu?      Excl. in [place]?    No data found.  Updated Vital Signs BP (!) 153/87 (BP Location: Right Arm)   Pulse 85   Temp 97.7 F (36.5 C) (Oral)   Resp 16   SpO2 96%   Visual Acuity Right Eye Distance:   Left Eye Distance:   Bilateral Distance:    Right Eye Near:   Left Eye Near:    Bilateral Near:     Physical Exam Vitals and nursing note reviewed.  Constitutional:      Appearance: She is well-developed.     Comments: No acute distress  HENT:     Head: Normocephalic and atraumatic.     Ears:     Comments: Bilateral TMs appear opaque dull and irregular, minimal erythema, poor bony landmarks    Nose: Nose normal.     Mouth/Throat:     Comments: Oral mucosa pink and moist, no tonsillar enlargement or exudate. Posterior pharynx patent and nonerythematous, no uvula deviation or swelling. Normal phonation. Eyes:     Conjunctiva/sclera: Conjunctivae normal.  Cardiovascular:     Rate and Rhythm: Normal rate.  Pulmonary:     Effort: Pulmonary effort is normal. No respiratory distress.     Comments: Breathing comfortably at rest, mild expiratory rhonchi noted throughout bilateral lung fields Abdominal:     General: There is no distension.  Musculoskeletal:        General: Normal range of motion.      Cervical back: Neck supple.  Skin:    General: Skin is warm and dry.  Neurological:     Mental Status: She is alert and oriented to person, place, and time.      UC Treatments / Results  Labs (all labs ordered are listed, but only abnormal results are displayed) Labs Reviewed  CULTURE, GROUP A STREP ([place])  NOVEL CORONAVIRUS, NAA  POCT RAPID STREP A (OFFICE)    EKG   Radiology No results found.  Procedures Procedures (including critical care time)  Medications Ordered in UC Medications - No data to display  Initial Impression / Assessment and Plan / UC Course  I have reviewed the triage vital signs and the nursing notes.  Pertinent labs & imaging results that were available during my care of the patient were reviewed by me and considered in my medical decision making (see chart for details).     COVID test pending for screening, opting to treat for sinusitis given allergy symptoms times weeks with recent worsening as well as appearance of ears, covering with Augmentin, prednisone course for sinus inflammation inflammation developing in lungs  Discussed strict return precautions. Patient verbalized understanding and is agreeable with plan.  Final Clinical Impressions(s) / UC Diagnoses   Final diagnoses:  Nasal congestion  Acute non-recurrent maxillary sinusitis     Discharge Instructions     COVID test pending, monitor MyChart for results Augmentin twice daily for 1 week to treat sinus infection Daily cetirizine/Zyrtec and Flonase for congestion and drainage Prednisone 40 mg daily for 5 days to help with sinus inflammation and inflammation in chest Tessalon every 8 hours for cough Rest and fluids Follow-up if not improving or worsening    ED Prescriptions  Medication Sig Dispense Auth. Provider   amoxicillin-clavulanate (AUGMENTIN) 875-125 MG tablet Take 1 tablet by mouth every 12 (twelve) hours. 14 tablet Sherina Stammer C, PA-C   fluticasone  (FLONASE) 50 MCG/ACT nasal spray Place 1-2 sprays into both nostrils daily. 16 g Darby Fleeman C, PA-C   Cetirizine HCl 10 MG CAPS Take 1 capsule (10 mg total) by mouth daily. 30 capsule Aldona Bryner C, PA-C   benzonatate (TESSALON) 200 MG capsule Take 1 capsule (200 mg total) by mouth 3 (three) times daily as needed for up to 7 days for cough. 28 capsule Deseray Daponte C, PA-C   predniSONE (DELTASONE) 20 MG tablet Take 2 tablets (40 mg total) by mouth daily for 5 days. 10 tablet [place]ss Cros[place], [place] C, PA-C     PDMP not reviewed this encounter.   Janith Lima, PA-C 10/11/20 1147

## 2020-10-11 NOTE — Discharge Instructions (Addendum)
COVID test pending, monitor MyChart for results Augmentin twice daily for 1 week to treat sinus infection Daily cetirizine/Zyrtec and Flonase for congestion and drainage Prednisone 40 mg daily for 5 days to help with sinus inflammation and inflammation in chest Tessalon every 8 hours for cough Rest and fluids Follow-up if not improving or worsening

## 2020-10-12 ENCOUNTER — Ambulatory Visit: Payer: Self-pay

## 2020-10-13 LAB — CULTURE, GROUP A STREP (THRC)

## 2020-10-14 LAB — NOVEL CORONAVIRUS, NAA: SARS-CoV-2, NAA: NOT DETECTED

## 2020-10-14 LAB — SARS-COV-2, NAA 2 DAY TAT

## 2020-10-18 ENCOUNTER — Encounter: Payer: Self-pay | Admitting: Family Medicine

## 2020-10-19 MED ORDER — AMOXICILLIN-POT CLAVULANATE 875-125 MG PO TABS: 1.0000 | ORAL_TABLET | Freq: Two times a day (BID) | ORAL | 0 refills | Status: DC

## 2020-11-11 ENCOUNTER — Encounter: Payer: Self-pay | Admitting: *Deleted

## 2020-11-11 ENCOUNTER — Other Ambulatory Visit: Payer: Self-pay | Admitting: Family Medicine

## 2020-11-11 DIAGNOSIS — E785 Hyperlipidemia, unspecified: Secondary | ICD-10-CM

## 2020-11-17 ENCOUNTER — Encounter: Payer: Self-pay | Admitting: Family Medicine

## 2020-11-18 ENCOUNTER — Encounter: Payer: Self-pay | Admitting: Internal Medicine

## 2020-11-20 ENCOUNTER — Encounter: Payer: Self-pay | Admitting: Family Medicine

## 2021-01-07 ENCOUNTER — Ambulatory Visit: Payer: BC Managed Care – PPO | Admitting: Family Medicine

## 2021-01-26 ENCOUNTER — Other Ambulatory Visit: Payer: Self-pay

## 2021-01-26 ENCOUNTER — Ambulatory Visit (INDEPENDENT_AMBULATORY_CARE_PROVIDER_SITE_OTHER): Payer: BC Managed Care – PPO | Admitting: Family Medicine

## 2021-01-26 ENCOUNTER — Encounter: Payer: Self-pay | Admitting: Family Medicine

## 2021-01-26 VITALS — BP 130/82 | HR 57 | Temp 97.2°F | Resp 16 | Ht 64.0 in | Wt 200.0 lb

## 2021-01-26 DIAGNOSIS — N3946 Mixed incontinence: Secondary | ICD-10-CM

## 2021-01-26 DIAGNOSIS — I10 Essential (primary) hypertension: Secondary | ICD-10-CM

## 2021-01-26 DIAGNOSIS — E785 Hyperlipidemia, unspecified: Secondary | ICD-10-CM | POA: Diagnosis not present

## 2021-01-26 DIAGNOSIS — R159 Full incontinence of feces: Secondary | ICD-10-CM | POA: Diagnosis not present

## 2021-01-26 LAB — CBC WITH DIFFERENTIAL/PLATELET
Basophils Absolute: 0 10*3/uL (ref 0.0–0.1)
Basophils Relative: 0.4 % (ref 0.0–3.0)
Eosinophils Absolute: 0.1 10*3/uL (ref 0.0–0.7)
Eosinophils Relative: 2.4 % (ref 0.0–5.0)
HCT: 41.7 % (ref 36.0–46.0)
Hemoglobin: 13.9 g/dL (ref 12.0–15.0)
Lymphocytes Relative: 25.4 % (ref 12.0–46.0)
Lymphs Abs: 1.2 10*3/uL (ref 0.7–4.0)
MCHC: 33.2 g/dL (ref 30.0–36.0)
MCV: 94.8 fl (ref 78.0–100.0)
Monocytes Absolute: 0.4 10*3/uL (ref 0.1–1.0)
Monocytes Relative: 7.6 % (ref 3.0–12.0)
Neutro Abs: 3.1 10*3/uL (ref 1.4–7.7)
Neutrophils Relative %: 64.2 % (ref 43.0–77.0)
Platelets: 212 10*3/uL (ref 150.0–400.0)
RBC: 4.4 Mil/uL (ref 3.87–5.11)
RDW: 12.9 % (ref 11.5–15.5)
WBC: 4.8 10*3/uL (ref 4.0–10.5)

## 2021-01-26 LAB — LIPID PANEL
Cholesterol: 175 mg/dL (ref 0–200)
HDL: 64.7 mg/dL (ref >39.00)
LDL Cholesterol: 88 mg/dL (ref 0–99)
NonHDL: 109.81
Total CHOL/HDL Ratio: 3
Triglycerides: 107 mg/dL (ref 0.0–149.0)
VLDL: 21.4 mg/dL (ref 0.0–40.0)

## 2021-01-26 LAB — BASIC METABOLIC PANEL
BUN: 12 mg/dL (ref 6–23)
CO2: 26 mEq/L (ref 19–32)
Calcium: 8.5 mg/dL (ref 8.4–10.5)
Chloride: 105 mEq/L (ref 96–112)
Creatinine, Ser: 0.7 mg/dL (ref 0.40–1.20)
GFR: 90.67 mL/min (ref >60.00)
Glucose, Bld: 90 mg/dL (ref 70–99)
Potassium: 3.9 mEq/L (ref 3.5–5.1)
Sodium: 139 mEq/L (ref 135–145)

## 2021-01-26 LAB — HEPATIC FUNCTION PANEL
ALT: 18 U/L (ref 0–35)
AST: 16 U/L (ref 0–37)
Albumin: 3.6 g/dL (ref 3.5–5.2)
Alkaline Phosphatase: 77 U/L (ref 39–117)
Bilirubin, Direct: 0.1 mg/dL (ref 0.0–0.3)
Total Bilirubin: 0.4 mg/dL (ref 0.2–1.2)
Total Protein: 6 g/dL (ref 6.0–8.3)

## 2021-01-26 LAB — TSH: TSH: 2.51 u[IU]/mL (ref 0.35–5.50)

## 2021-01-26 NOTE — Assessment & Plan Note (Signed)
Chronic problem.  Currently adequate control on Lasix and Losartan.  Check labs.  No anticipated med changes.  Will follow.

## 2021-01-26 NOTE — Patient Instructions (Signed)
Schedule your complete physical in 6 months We'll notify you of your lab results and make any changes if needed Continue to work on healthy diet and regular exercise- you can do it! We'll call you with your Urology appt Make sure you discuss your bowel incontinence with Dr Ardis Hughs Call with any questions or concerns Have a great fall!!!

## 2021-01-26 NOTE — Progress Notes (Signed)
   Subjective:    Patient ID: Bianca Hogan, female    DOB: 03-01-1956, 65 y.o.   MRN: [id number]  HPI HTN- chronic problem, on Lasix '20mg'$  daily, Losartan '50mg'$  daily w/ good control.  No CP, SOB, HAs, visual changes, edema.  Hyperlipidemia- chronic problem, on Lipitor '20mg'$  daily.  Denies abd pain, N/V  Urinary incontinence- pt reports she had surgery 15 yrs ago but results only lasted 10 months.  Pt reports she will have leakage w/ coughing and sneezing.  Bowel incontinence- 'it just happens sometimes'  occurs q4-6 months.  pt reports 'a lot of trouble w/ diarrhea this summer'.  Started taking prebiotic/probiotic w/ some relief.  Has colonoscopy scheduled 10/7 and sees Dr Henrene Pastor on 9/23.   Review of Systems For ROS see HPI   This visit occurred during the SARS-CoV-2 public health emergency.  Safety protocols were in place, including screening questions prior to the visit, additional usage of staff PPE, and extensive cleaning of exam room while observing appropriate contact time as indicated for disinfecting solutions.      Objective:   Physical Exam Vitals reviewed.  Constitutional:      General: She is not in acute distress.    Appearance: Normal appearance. She is well-developed. She is not ill-appearing.  HENT:     Head: Normocephalic and atraumatic.  Eyes:     Conjunctiva/sclera: Conjunctivae normal.     Pupils: Pupils are equal, round, and reactive to light.  Neck:     Thyroid: No thyromegaly.  Cardiovascular:     Rate and Rhythm: Normal rate and regular rhythm.     Heart sounds: Normal heart sounds. No murmur heard. Pulmonary:     Effort: Pulmonary effort is normal. No respiratory distress.     Breath sounds: Normal breath sounds.  Abdominal:     General: There is no distension.     Palpations: Abdomen is soft.     Tenderness: There is no abdominal tenderness.  Musculoskeletal:     Cervical back: Normal range of motion and neck supple.     Right lower leg: No edema.      Left lower leg: No edema.  Lymphadenopathy:     Cervical: No cervical adenopathy.  Skin:    General: Skin is warm and dry.  Neurological:     General: No focal deficit present.     Mental Status: She is alert and oriented to person, place, and time.  Psychiatric:        Mood and Affect: Mood normal.        Behavior: Behavior normal.          Assessment & Plan:   Bowel incontinence- new to provider, occurs periodically for pt w/o any particular trigger and w/o warning.  Occurs approximately once every 4-6 months.  Pt is understandably distressed when this occurs.  She has upcoming GI appt and colonoscopy scheduled.  Encouraged her to discuss w/ GI and I will send a copy of today's note.

## 2021-01-26 NOTE — Assessment & Plan Note (Signed)
Chronic problem.  On Lipitor '20mg'$  daily w/o difficulty.  Encouraged healthy diet and regular exercise.  Check labs.  Adjust meds prn

## 2021-01-26 NOTE — Assessment & Plan Note (Signed)
Ongoing issue for pt.  S/p surgery at age 65 that subsequently failed.  The sxs she's describing today are more stress incontinence- coughing, sneezing, laughing, etc.  Discussed urology referral and possible pelvic floor PT.  Pt expressed understanding and is in agreement w/ plan.

## 2021-02-05 ENCOUNTER — Encounter: Payer: Self-pay | Admitting: Internal Medicine

## 2021-02-05 ENCOUNTER — Other Ambulatory Visit: Payer: Self-pay

## 2021-02-05 ENCOUNTER — Ambulatory Visit (AMBULATORY_SURGERY_CENTER): Payer: Self-pay | Admitting: *Deleted

## 2021-02-05 VITALS — Ht 64.0 in | Wt 202.0 lb

## 2021-02-05 DIAGNOSIS — Z86010 Personal history of colonic polyps: Secondary | ICD-10-CM

## 2021-02-05 MED ORDER — SUTAB 1479-225-188 MG PO TABS: 1.0000 | ORAL_TABLET | ORAL | 0 refills | Status: DC

## 2021-02-05 NOTE — Progress Notes (Signed)
Patient is here in-person for PV. Patient denies any allergies to eggs or soy. Patient denies any problems with anesthesia/sedation. Patient is not on any oxygen at home. Patient is not taking any diet/weight loss medications or blood thinners. Patient is aware of our care-partner policy and [id number] safety protocol.   EMMI education assigned to the patient for the procedure, sent to [place].   Patient is COVID-19 vaccinated.  Sutab Prep Prescription coupon was given to the patient.

## 2021-02-19 ENCOUNTER — Encounter: Payer: Self-pay | Admitting: Internal Medicine

## 2021-02-19 ENCOUNTER — Ambulatory Visit (AMBULATORY_SURGERY_CENTER): Payer: BC Managed Care – PPO | Admitting: Internal Medicine

## 2021-02-19 ENCOUNTER — Other Ambulatory Visit: Payer: Self-pay

## 2021-02-19 VITALS — BP 134/66 | HR 57 | Temp 98.4°F | Resp 15 | Ht 64.0 in | Wt 202.0 lb

## 2021-02-19 DIAGNOSIS — R197 Diarrhea, unspecified: Secondary | ICD-10-CM

## 2021-02-19 DIAGNOSIS — Z86010 Personal history of colonic polyps: Secondary | ICD-10-CM

## 2021-02-19 MED ORDER — SODIUM CHLORIDE 0.9 % IV SOLN: 500.0000 mL | Freq: Once | INTRAVENOUS | Status: DC

## 2021-02-19 NOTE — Progress Notes (Signed)
Sedate, gd SR, tolerated procedure well, VSS, report to RN 

## 2021-02-19 NOTE — Patient Instructions (Signed)
Biopsies taken.  Await pathology for final recommendations.  Handouts on findings (diverticulosis) given to patient.     Citrucel powder recommended for bowel irregularities.  Take 2 tablespoons daily in 12 ounces of water or juice.   Resume previous medications.  YOU HAD AN ENDOSCOPIC PROCEDURE TODAY AT [facility]:   Refer to the procedure report that was given to you for any specific questions about what was found during the examination.  If the procedure report does not answer your questions, please call your gastroenterologist to clarify.  If you requested that your care partner not be given the details of your procedure findings, then the procedure report has been included in a sealed envelope for you to review at your convenience later.  YOU SHOULD EXPECT: Some feelings of bloating in the abdomen. Passage of more gas than usual.  Walking can help get rid of the air that was put into your GI tract during the procedure and reduce the bloating. If you had a lower endoscopy (such as a colonoscopy or flexible sigmoidoscopy) you may notice spotting of blood in your stool or on the toilet paper. If you underwent a bowel prep for your procedure, you may not have a normal bowel movement for a few days.  Please Note:  You might notice some irritation and congestion in your nose or some drainage.  This is from the oxygen used during your procedure.  There is no need for concern and it should clear up in a day or so.  SYMPTOMS TO REPORT IMMEDIATELY:  Following lower endoscopy (colonoscopy or flexible sigmoidoscopy):  Excessive amounts of blood in the stool  Significant tenderness or worsening of abdominal pains  Swelling of the abdomen that is new, acute  Fever of 100F or higher   For urgent or emergent issues, a gastroenterologist can be reached at any hour by calling [phone]. Do not use MyChart messaging for urgent concerns.    DIET:  We do recommend a small meal at  first, but then you may proceed to your regular diet.  Drink plenty of fluids but you should avoid alcoholic beverages for 24 hours.  ACTIVITY:  You should plan to take it easy for the rest of today and you should NOT DRIVE or use heavy machinery until tomorrow (because of the sedation medicines used during the test).    FOLLOW UP: Our staff will call the number listed on your records 48-72 hours following your procedure to check on you and address any questions or concerns that you may have regarding the information given to you following your procedure. If we do not reach you, we will leave a message.  We will attempt to reach you two times.  During this call, we will ask if you have developed any symptoms of COVID 19. If you develop any symptoms (ie: fever, flu-like symptoms, shortness of breath, cough etc.) before then, please call [phone].  If you test positive for Covid 19 in the 2 weeks post procedure, please call and report this information to [place].    If any biopsies were taken you will be contacted by phone or by letter within the next 1-3 weeks.  Please call us at [phone] if you have not heard about the biopsies in 3 weeks.    SIGNATURES/CONFIDENTIALITY: You and/or your care partner have signed paperwork which will be entered into your electronic medical record.  These signatures attest to the fact that that the information above on your  After Visit Summary has been reviewed and is understood.  Full responsibility of the confidentiality of this discharge information lies with you and/or your care-partner.

## 2021-02-19 NOTE — Progress Notes (Signed)
Called to room to assist during endoscopic procedure.  Patient ID and intended procedure confirmed with present staff. Received instructions for my participation in the procedure from the performing physician.  

## 2021-02-19 NOTE — Progress Notes (Signed)
HISTORY OF PRESENT ILLNESS:  Bianca Hogan is a 65 y.o. female with a history of multiple adenomatous colon polyps.  Presents today for surveillance colonoscopy.  Last examination 2017.  Also complains of diarrhea with occasional incontinence  REVIEW OF SYSTEMS:  All non-GI ROS negative.  Past Medical History:  Diagnosis Date   Allergy    Bronchitis 01/23/2011   GERD (gastroesophageal reflux disease)    HLD (hyperlipidemia)    Hypertension    Osteoarthritis    Shortness of breath    dyspenea; echo 12/10 EF 55-60%, nml; myoview 12/10 EF 83%, nml    Past Surgical History:  Procedure Laterality Date   BLADDER SUSPENSION  04/16/2007   chest CT  11/14/2006   pulmonary nodule Rt middle    COLONOSCOPY  12/18/2015   Dr.Kenlee Maler   CYSTOCELE REPAIR     DILATION AND CURETTAGE OF UTERUS  05/16/1984   miscarriage    POLYPECTOMY     TONSILLECTOMY     VAGINAL HYSTERECTOMY     no cervix    Social History Bianca Hogan  reports that she quit smoking about 33 years ago. Her smoking use included cigarettes. She has never used smokeless tobacco. She reports current alcohol use of about 10.0 standard drinks per week. She reports that she does not use drugs.  family history includes Diabetes in her mother, sister, and sister; Heart disease in her father, maternal grandfather, maternal grandmother, mother, paternal grandfather, paternal grandmother, sister, and sister.  Allergies  Allergen Reactions   Eggs Or Egg-Derived Products Diarrhea   Lisinopril Cough   Naproxen Nausea Only       PHYSICAL EXAMINATION:  Vital signs: BP (!) 115/59   Pulse 66   Temp 98.4 F (36.9 C) (Temporal)   Resp 16   Ht 5\' 4"  (1.626 m)   Wt 202 lb (91.6 kg)   SpO2 94%   BMI 34.67 kg/m  General: Well-developed, well-nourished, no acute distress HEENT: Sclerae are anicteric, conjunctiva pink. Oral mucosa intact Lungs: Clear Heart: Regular Abdomen: soft, nontender, nondistended, no obvious ascites, no  peritoneal signs, normal bowel sounds. No organomegaly. Extremities: No edema Psychiatric: alert and oriented x3. Cooperative     ASSESSMENT:  1.  Multiple adenomatous colon polyps.  Due for surveillance 2.  Diarrhea and incontinence   PLAN:  1.  Colonoscopy with biopsies

## 2021-02-19 NOTE — Progress Notes (Signed)
VS  DT ? ?Pt's states no medical or surgical changes since previsit or office visit. ? ?

## 2021-02-19 NOTE — Op Note (Signed)
[place] Patient Name: Bianca Hogan Procedure Date: 02/19/2021 10:40 AM MRN: [id number] Endoscopist: Docia Chuck. Henrene Pastor , MD Age: 65 Referring MD:  Date of Birth: May 11, 1956 Gender: Female Account #: [id number] Procedure:                Colonoscopy with biopsies Indications:              High risk colon cancer surveillance: Personal                            history of multiple (3 or more) adenomas index                            examination [facility] Subsequent                            examinations here 2012 and 2017 Medicines:                Monitored Anesthesia Care Procedure:                Pre-Anesthesia Assessment:                           - Prior to the procedure, a History and Physical                            was performed, and patient medications and                            allergies were reviewed. The patient's tolerance of                            previous anesthesia was also reviewed. The risks                            and benefits of the procedure and the sedation                            options and risks were discussed with the patient.                            All questions were answered, and informed consent                            was obtained. Prior Anticoagulants: The patient has                            taken no previous anticoagulant or antiplatelet                            agents. ASA Grade Assessment: I - A normal, healthy                            patient. After reviewing the risks and benefits,  the patient was deemed in satisfactory condition to                            undergo the procedure.                           After obtaining informed consent, the colonoscope                            was passed under direct vision. Throughout the                            procedure, the patient's blood pressure, pulse, and                            oxygen saturations were  monitored continuously. The                            Olympus CF-HQ190L [id number]) Colonoscope was                            introduced through the anus and advanced to the the                            cecum, identified by appendiceal orifice and                            ileocecal valve. The ileocecal valve, appendiceal                            orifice, and rectum were photographed. The quality                            of the bowel preparation was excellent. The                            colonoscopy was performed without difficulty. The                            patient tolerated the procedure well. The bowel                            preparation used was SUPREP via split dose                            instruction. Scope In: 11:00:23 AM Scope Out: 11:12:12 AM Scope Withdrawal Time: 0 hours 9 minutes 41 seconds  Total Procedure Duration: 0 hours 11 minutes 49 seconds  Findings:                 Multiple diverticula were found in the sigmoid                            colon.  The entire examined colon appeared otherwise normal                            on direct and retroflexion views. Biopsies for                            histology were taken with a cold forceps from the                            entire colon for evaluation of microscopic colitis. Complications:            No immediate complications. Estimated blood loss:                            None. Estimated Blood Loss:     Estimated blood loss: none. Impression:               - Diverticulosis in the sigmoid colon.                           - The entire examined colon is otherwise normal on                            direct and retroflexion views. Recommendation:           - Repeat colonoscopy in 5 years for surveillance.                           - Patient has a contact number available for                            emergencies. The signs and symptoms of potential                             delayed complications were discussed with the                            patient. Return to normal activities tomorrow.                            Written discharge instructions were provided to the                            patient.                           - Resume previous diet.                           - Continue present medications.                           - Await pathology results.                           - RECOMMEND CITRUCEL POWDER. Take 2 tablespoons  daily in 12 ounces of water or juice. This may help                            your bowel irregularities. Docia Chuck. Henrene Pastor, MD 02/19/2021 11:22:09 AM This report has been signed electronically.

## 2021-02-23 ENCOUNTER — Encounter: Payer: Self-pay | Admitting: Internal Medicine

## 2021-02-23 ENCOUNTER — Telehealth: Payer: Self-pay | Admitting: *Deleted

## 2021-02-23 NOTE — Telephone Encounter (Signed)
First attempt, left VM.  

## 2021-02-23 NOTE — Telephone Encounter (Signed)
  Follow up Call-  Call back number 02/19/2021  Post procedure Call Back phone  # [phone]  Permission to leave phone message Yes  Some recent data might be hidden     Patient questions:  Do you have a fever, pain , or abdominal swelling? No. Pain Score  0 *  Have you tolerated food without any problems? Yes.    Have you been able to return to your normal activities? Yes.    Do you have any questions about your discharge instructions: Diet   No. Medications  No. Follow up visit  No.  Do you have questions or concerns about your Care? No.  Actions: * If pain score is 4 or above: No action needed, pain <4.  Have you developed a fever since your procedure? no  2.   Have you had an respiratory symptoms (SOB or cough) since your procedure? no  3.   Have you tested positive for COVID 19 since your procedure no  4.   Have you had any family members/close contacts diagnosed with the COVID 19 since your procedure?  no   If yes to any of these questions please route to Joylene John, RN and Joella Prince, RN

## 2021-02-23 NOTE — Telephone Encounter (Deleted)
  Follow up Call-  Call back number 02/19/2021  Post procedure Call Back phone  # [phone]  Permission to leave phone message Yes  Some recent data might be hidden     Patient questions:  Do you have a fever, pain , or abdominal swelling? No. Pain Score  0 *  Have you tolerated food without any problems? Yes.    Have you been able to return to your normal activities? Yes.    Do you have any questions about your discharge instructions: Diet   No. Medications  No. Follow up visit  No.  Do you have questions or concerns about your Care? No.  Actions: * If pain score is 4 or above: No action needed, pain <4.

## 2021-03-20 ENCOUNTER — Other Ambulatory Visit: Payer: Self-pay | Admitting: Family Medicine

## 2021-03-21 ENCOUNTER — Other Ambulatory Visit: Payer: Self-pay | Admitting: Family Medicine

## 2021-05-03 ENCOUNTER — Encounter: Payer: Self-pay | Admitting: Family Medicine

## 2021-05-03 ENCOUNTER — Other Ambulatory Visit: Payer: Self-pay | Admitting: Family Medicine

## 2021-05-03 ENCOUNTER — Other Ambulatory Visit: Payer: Self-pay

## 2021-05-03 MED ORDER — VALACYCLOVIR HCL 1 G PO TABS: ORAL_TABLET | ORAL | 1 refills | Status: DC

## 2021-05-27 ENCOUNTER — Ambulatory Visit (INDEPENDENT_AMBULATORY_CARE_PROVIDER_SITE_OTHER): Payer: BC Managed Care – PPO | Admitting: Podiatry

## 2021-05-27 ENCOUNTER — Ambulatory Visit (INDEPENDENT_AMBULATORY_CARE_PROVIDER_SITE_OTHER): Payer: BC Managed Care – PPO

## 2021-05-27 ENCOUNTER — Other Ambulatory Visit: Payer: Self-pay

## 2021-05-27 DIAGNOSIS — M21961 Unspecified acquired deformity of right lower leg: Secondary | ICD-10-CM

## 2021-05-27 DIAGNOSIS — M7741 Metatarsalgia, right foot: Secondary | ICD-10-CM | POA: Diagnosis not present

## 2021-05-27 DIAGNOSIS — M79671 Pain in right foot: Secondary | ICD-10-CM

## 2021-05-27 DIAGNOSIS — M2021 Hallux rigidus, right foot: Secondary | ICD-10-CM

## 2021-05-27 MED ORDER — MELOXICAM 15 MG PO TABS: 15.0000 mg | ORAL_TABLET | Freq: Every day | ORAL | 0 refills | Status: AC | PRN

## 2021-06-01 NOTE — Progress Notes (Signed)
Subjective: 66 year old female presents the office today for evaluation continued right foot pain.  She was last seen in the office in July 2020.  She said that she gets pain in the top of her right foot as well as some swelling in the bottom.  She states that walking the ball of her foot causes pain she is in stabbing discomfort which has been intermittent.  No recent injury or changes otherwise since I last saw her she has no new concerns.  Objective: AAO x3, NAD DP/PT pulses palpable bilaterally, CRT less than 3 seconds To palpation still on the third interspace of the right foot as well as submetatarsal area.  There is no area pinpoint tenderness.  There is minimal edema.  No erythema or warmth flexor, extensor tendons appear to be intact.  Decreased range of motion of first MPJ.  MMT 5/5. No pain with calf compression, swelling, warmth, erythema  Assessment: 66 year old female metatarsalgia  Plan: -All treatment options discussed with the patient including all alternatives, risks, complications.  -X-rays obtained reviewed and there is significant arthritic changes present first MPJ with medial deviation of the second and third toes.  Elongated second third metatarsals. -I debrided her pain today is likely due to mechanics given the toes as well as along the metatarsals which is causing inflammation possible neuroma.  We discussed both conservative as well as surgical treatment options.  For having orthotics adjusted versus new orthotics Follow-up with Aaron Edelman, our orthotist for this.  Prescribed meloxicam.  If symptoms continue consider surgical intervention. -Patient encouraged to call the office with any questions, concerns, change in symptoms.    Trula Slade DPM

## 2021-06-03 ENCOUNTER — Ambulatory Visit: Payer: BC Managed Care – PPO

## 2021-06-03 ENCOUNTER — Other Ambulatory Visit: Payer: Self-pay

## 2021-06-03 DIAGNOSIS — M2021 Hallux rigidus, right foot: Secondary | ICD-10-CM

## 2021-06-03 DIAGNOSIS — M21961 Unspecified acquired deformity of right lower leg: Secondary | ICD-10-CM

## 2021-06-03 DIAGNOSIS — M79671 Pain in right foot: Secondary | ICD-10-CM

## 2021-06-03 DIAGNOSIS — M7741 Metatarsalgia, right foot: Secondary | ICD-10-CM

## 2021-06-03 NOTE — Progress Notes (Signed)
SITUATION Reason for Consult: Evaluation for Bilateral Custom Foot Orthoses Patient / Caregiver Report: Patient is ready for foot orthotics  OBJECTIVE DATA: Patient History / Diagnosis:    ICD-10-CM   1. Metatarsalgia, right foot  M77.41     2. Hallux rigidus of right foot  M20.21     3. Metatarsal deformity, right  M21.961     4. Right foot pain  M79.671       Current or Previous Devices: Custom insoles from [place] Examination: Skin presentation:   Intact Ulcers & Callousing:   None and no history Toe / Foot Deformities:  Prominent met heads Weight Bearing Presentation:  Rectus Sensation:    Intact  ORTHOTIC RECOMMENDATION Recommended Device: 1x pair of custom functional foot orthotics  GOALS OF ORTHOSES - Reduce Pain - Prevent Foot Deformity - Prevent Progression of Further Foot Deformity - Relieve Pressure - Improve the Overall Biomechanical Function of the Foot and Lower Extremity.  ACTIONS PERFORMED Patient was casted for Foot Orthoses via crush box. Procedure was explained and patient tolerated procedure well. All questions were answered and concerns addressed.  PLAN Potential out of pocket cost was communicated to patient. Casts are to be sent to [facility] for fabrication. Patient is to be called for fitting when devices are ready.

## 2021-06-14 ENCOUNTER — Other Ambulatory Visit: Payer: Self-pay

## 2021-06-14 DIAGNOSIS — E785 Hyperlipidemia, unspecified: Secondary | ICD-10-CM

## 2021-06-14 MED ORDER — LOSARTAN POTASSIUM 50 MG PO TABS: ORAL_TABLET | ORAL | 1 refills | Status: DC

## 2021-06-14 MED ORDER — PANTOPRAZOLE SODIUM 40 MG PO TBEC: DELAYED_RELEASE_TABLET | ORAL | 1 refills | Status: DC

## 2021-06-14 MED ORDER — ESTRADIOL 0.5 MG PO TABS: ORAL_TABLET | ORAL | 0 refills | Status: DC

## 2021-06-14 MED ORDER — ATORVASTATIN CALCIUM 20 MG PO TABS: ORAL_TABLET | ORAL | 1 refills | Status: DC

## 2021-06-27 ENCOUNTER — Encounter: Payer: Self-pay | Admitting: Family Medicine

## 2021-06-27 DIAGNOSIS — Z1283 Encounter for screening for malignant neoplasm of skin: Secondary | ICD-10-CM

## 2021-06-28 ENCOUNTER — Other Ambulatory Visit: Payer: Self-pay | Admitting: Family Medicine

## 2021-06-28 MED ORDER — FLUTICASONE PROPIONATE 50 MCG/ACT NA SUSP: 2.0000 | Freq: Every day | NASAL | 6 refills | Status: DC

## 2021-07-13 ENCOUNTER — Ambulatory Visit (INDEPENDENT_AMBULATORY_CARE_PROVIDER_SITE_OTHER): Payer: BC Managed Care – PPO

## 2021-07-13 ENCOUNTER — Other Ambulatory Visit: Payer: Self-pay

## 2021-07-13 DIAGNOSIS — M2021 Hallux rigidus, right foot: Secondary | ICD-10-CM

## 2021-07-13 DIAGNOSIS — M21961 Unspecified acquired deformity of right lower leg: Secondary | ICD-10-CM

## 2021-07-13 DIAGNOSIS — M216X1 Other acquired deformities of right foot: Secondary | ICD-10-CM | POA: Diagnosis not present

## 2021-07-13 DIAGNOSIS — M216X2 Other acquired deformities of left foot: Secondary | ICD-10-CM

## 2021-07-13 DIAGNOSIS — M7741 Metatarsalgia, right foot: Secondary | ICD-10-CM

## 2021-07-13 NOTE — Progress Notes (Signed)
SITUATION: Reason for Visit: Fitting and Delivery of Custom Fabricated Foot Orthoses Patient Report: Patient reports comfort and is satisfied with device.  OBJECTIVE DATA: Patient History / Diagnosis:     ICD-10-CM   1. Metatarsalgia, right foot  M77.41     2. Hallux rigidus of right foot  M20.21     3. Metatarsal deformity, right  M21.961       Provided Device:  Custom Functional Foot Orthotics     [facility] [employer]: [id number] GOAL OF ORTHOSIS - Improve gait - Decrease energy expenditure - Improve Balance - Provide Triplanar stability of foot complex - Facilitate motion  ACTIONS PERFORMED Patient was fit with foot orthotics trimmed to shoe last. Patient tolerated fittign procedure.   Patient was provided with verbal and written instruction and demonstration regarding donning, doffing, wear, care, proper fit, function, purpose, cleaning, and use of the orthosis and in all related precautions and risks and benefits regarding the orthosis.  Patient was also provided with verbal instruction regarding how to report any failures or malfunctions of the orthosis and necessary follow up care. Patient was also instructed to contact our office regarding any change in status that may affect the function of the orthosis.  Patient demonstrated independence with proper donning, doffing, and fit and verbalized understanding of all instructions.  PLAN: Patient is to follow up in one week or as necessary (PRN). All questions were answered and concerns addressed. Plan of care was discussed with and agreed upon by the patient.

## 2021-07-22 ENCOUNTER — Other Ambulatory Visit: Payer: Self-pay | Admitting: Family Medicine

## 2021-07-22 DIAGNOSIS — Z1231 Encounter for screening mammogram for malignant neoplasm of breast: Secondary | ICD-10-CM

## 2021-07-26 ENCOUNTER — Encounter: Payer: BC Managed Care – PPO | Admitting: Family Medicine

## 2021-08-09 ENCOUNTER — Ambulatory Visit (INDEPENDENT_AMBULATORY_CARE_PROVIDER_SITE_OTHER): Payer: BC Managed Care – PPO | Admitting: Family Medicine

## 2021-08-09 ENCOUNTER — Encounter: Payer: Self-pay | Admitting: Family Medicine

## 2021-08-09 VITALS — BP 120/82 | HR 63 | Temp 97.6°F | Resp 16 | Ht 64.5 in | Wt 198.8 lb

## 2021-08-09 DIAGNOSIS — I10 Essential (primary) hypertension: Secondary | ICD-10-CM

## 2021-08-09 DIAGNOSIS — Z23 Encounter for immunization: Secondary | ICD-10-CM

## 2021-08-09 DIAGNOSIS — E559 Vitamin D deficiency, unspecified: Secondary | ICD-10-CM | POA: Diagnosis not present

## 2021-08-09 DIAGNOSIS — Z0000 Encounter for general adult medical examination without abnormal findings: Secondary | ICD-10-CM | POA: Diagnosis not present

## 2021-08-09 LAB — LIPID PANEL
Cholesterol: 209 mg/dL — ABNORMAL HIGH (ref 0–200)
HDL: 65.2 mg/dL (ref >39.00)
LDL Cholesterol: 106 mg/dL — ABNORMAL HIGH (ref 0–99)
NonHDL: 143.8
Total CHOL/HDL Ratio: 3
Triglycerides: 189 mg/dL — ABNORMAL HIGH (ref 0.0–149.0)
VLDL: 37.8 mg/dL (ref 0.0–40.0)

## 2021-08-09 LAB — CBC WITH DIFFERENTIAL/PLATELET
Basophils Absolute: 0 10*3/uL (ref 0.0–0.1)
Basophils Relative: 0.7 % (ref 0.0–3.0)
Eosinophils Absolute: 0.2 10*3/uL (ref 0.0–0.7)
Eosinophils Relative: 3.2 % (ref 0.0–5.0)
HCT: 40.4 % (ref 36.0–46.0)
Hemoglobin: 13.7 g/dL (ref 12.0–15.0)
Lymphocytes Relative: 26.3 % (ref 12.0–46.0)
Lymphs Abs: 1.8 10*3/uL (ref 0.7–4.0)
MCHC: 33.8 g/dL (ref 30.0–36.0)
MCV: 93.8 fl (ref 78.0–100.0)
Monocytes Absolute: 0.5 10*3/uL (ref 0.1–1.0)
Monocytes Relative: 7.4 % (ref 3.0–12.0)
Neutro Abs: 4.2 10*3/uL (ref 1.4–7.7)
Neutrophils Relative %: 62.4 % (ref 43.0–77.0)
Platelets: 255 10*3/uL (ref 150.0–400.0)
RBC: 4.31 Mil/uL (ref 3.87–5.11)
RDW: 12.9 % (ref 11.5–15.5)
WBC: 6.7 10*3/uL (ref 4.0–10.5)

## 2021-08-09 LAB — HEPATIC FUNCTION PANEL
ALT: 26 U/L (ref 0–35)
AST: 19 U/L (ref 0–37)
Albumin: 3.8 g/dL (ref 3.5–5.2)
Alkaline Phosphatase: 78 U/L (ref 39–117)
Bilirubin, Direct: 0.1 mg/dL (ref 0.0–0.3)
Total Bilirubin: 0.4 mg/dL (ref 0.2–1.2)
Total Protein: 6.2 g/dL (ref 6.0–8.3)

## 2021-08-09 LAB — BASIC METABOLIC PANEL
BUN: 19 mg/dL (ref 6–23)
CO2: 26 mEq/L (ref 19–32)
Calcium: 9 mg/dL (ref 8.4–10.5)
Chloride: 103 mEq/L (ref 96–112)
Creatinine, Ser: 0.69 mg/dL (ref 0.40–1.20)
GFR: 90.64 mL/min (ref >60.00)
Glucose, Bld: 98 mg/dL (ref 70–99)
Potassium: 4 mEq/L (ref 3.5–5.1)
Sodium: 137 mEq/L (ref 135–145)

## 2021-08-09 NOTE — Assessment & Plan Note (Signed)
Chronic problem.  Well controlled.  Asymptomatic.  Check labs.  No anticipated med changes. 

## 2021-08-09 NOTE — Assessment & Plan Note (Signed)
Check labs and replete prn. 

## 2021-08-09 NOTE — Assessment & Plan Note (Signed)
Pt's PE WNL w/ exception of obesity.  UTD on colonoscopy, immunizations (Pneumovax given today), mammo scheduled.  Check labs.  Anticipatory guidance provided.  ?

## 2021-08-09 NOTE — Patient Instructions (Signed)
Follow up 6 months to recheck BP and cholesterol ?We'll notify you of your lab results and make any changes if needed ?Continue to work on healthy diet and regular exercise- you can do it! ?Call with any questions or concerns ?Stay Safe!  Stay Healthy! ?Happy Spring!! ?

## 2021-08-09 NOTE — Progress Notes (Signed)
? ?  Subjective:  ? ? Patient ID: Bianca Hogan, female    DOB: 09-21-1955, 66 y.o.   MRN: [id number] ? ?HPI ?CPE- UTD on colonoscopy.  Mammo is scheduled for next month.  UTD on Tdap, flu, shingrix.  Due for Pneumovax. ? ?Patient Care Team  ?  Relationship Specialty Notifications Start End  ?Midge Minium, MD PCP - General Family Medicine  09/28/11   ?Irene Shipper, MD Consulting Physician Gastroenterology  09/22/14   ?  ? ?Health Maintenance  ?Topic Date Due  ? COVID-19 Vaccine (4 - Booster for Pfizer series) 04/08/2020  ? MAMMOGRAM  05/25/2021  ? Pneumonia Vaccine 14+ Years old (2 - PPSV23 if available, else PCV20) 07/10/2021  ? COLONOSCOPY (Pts 45-29yr Insurance coverage will need to be confirmed)  02/19/2026  ? TETANUS/TDAP  01/23/2030  ? INFLUENZA VACCINE  Completed  ? DEXA SCAN  Completed  ? Hepatitis C Screening  Completed  ? Zoster Vaccines- Shingrix  Completed  ? HPV VACCINES  Aged Out  ?  ? ? ?Review of Systems ?Patient reports no vision/ hearing changes, adenopathy,fever, weight change,  persistant/recurrent hoarseness , swallowing issues, chest pain, palpitations, edema, persistant/recurrent cough, hemoptysis, dyspnea (rest/exertional/paroxysmal nocturnal), gastrointestinal bleeding (melena, rectal bleeding), abdominal pain, significant heartburn, bowel changes, GU symptoms (dysuria, hematuria, incontinence), Gyn symptoms (abnormal  bleeding, pain),  syncope, focal weakness, memory loss, numbness & tingling, skin/hair/nail changes, abnormal bruising or bleeding, anxiety, or depression.  ? ?This visit occurred during the SARS-CoV-2 public health emergency.  Safety protocols were in place, including screening questions prior to the visit, additional usage of staff PPE, and extensive cleaning of exam room while observing appropriate contact time as indicated for disinfecting solutions.   ?   ?Objective:  ? Physical Exam ?General Appearance:    Alert, cooperative, no distress, appears stated age  ?Head:     Normocephalic, without obvious abnormality, atraumatic  ?Eyes:    PERRL, conjunctiva/corneas clear, EOM's intact, fundi  ?  benign, both eyes  ?Ears:    Normal TM's and external ear canals, both ears  ?Nose:   Deferred due to COVID  ?Throat:   ?Neck:   Supple, symmetrical, trachea midline, no adenopathy;  ?  Thyroid: no enlargement/tenderness/nodules  ?Back:     Symmetric, no curvature, ROM normal, no CVA tenderness  ?Lungs:     Clear to auscultation bilaterally, respirations unlabored  ?Chest Wall:    No tenderness or deformity  ? Heart:    Regular rate and rhythm, S1 and S2 normal, no murmur, rub ?  or gallop  ?Breast Exam:    Deferred to mammo  ?Abdomen:     Soft, non-tender, bowel sounds active all four quadrants,  ?  no masses, no organomegaly  ?Genitalia:    Deferred to GYN  ?Rectal:    ?Extremities:   Extremities normal, atraumatic, no cyanosis or edema  ?Pulses:   2+ and symmetric all extremities  ?Skin:   Skin color, texture, turgor normal, no rashes or lesions  ?Lymph nodes:   Cervical, supraclavicular, and axillary nodes normal  ?Neurologic:   CNII-XII intact, normal strength, sensation and reflexes  ?  throughout  ?  ? ? ? ?   ?Assessment & Plan:  ? ? ?

## 2021-08-10 ENCOUNTER — Encounter: Payer: Self-pay | Admitting: Family Medicine

## 2021-08-10 LAB — TSH: TSH: 1.8 u[IU]/mL (ref 0.35–5.50)

## 2021-08-10 LAB — VITAMIN D 25 HYDROXY (VIT D DEFICIENCY, FRACTURES): VITD: 23.28 ng/mL — ABNORMAL LOW (ref 30.00–100.00)

## 2021-08-11 ENCOUNTER — Telehealth: Payer: Self-pay

## 2021-08-11 ENCOUNTER — Other Ambulatory Visit: Payer: Self-pay

## 2021-08-11 MED ORDER — VITAMIN D (ERGOCALCIFEROL) 1.25 MG (50000 UNIT) PO CAPS: 50000.0000 [IU] | ORAL_CAPSULE | ORAL | 0 refills | Status: DC

## 2021-08-11 NOTE — Telephone Encounter (Signed)
MEDICATION: montelukast (SINGULAIR) 10 MG tablet //  ? ?[place], [address] ? ?Comments:  ? ?**Let patient know to contact pharmacy at the end of the day to make sure medication is ready. ** ? ?** Please notify patient to allow 48-72 hours to process** ? ?**Encourage patient to contact the pharmacy for refills or they can request refills through [facility]** ?  ?

## 2021-08-12 ENCOUNTER — Other Ambulatory Visit: Payer: Self-pay

## 2021-08-12 MED ORDER — MONTELUKAST SODIUM 10 MG PO TABS: ORAL_TABLET | ORAL | 6 refills | Status: DC

## 2021-08-12 NOTE — Telephone Encounter (Signed)
Rx sent 

## 2021-08-13 ENCOUNTER — Encounter: Payer: Self-pay | Admitting: Family Medicine

## 2021-08-13 ENCOUNTER — Other Ambulatory Visit: Payer: Self-pay

## 2021-08-13 MED ORDER — MONTELUKAST SODIUM 10 MG PO TABS: ORAL_TABLET | ORAL | 1 refills | Status: DC

## 2021-08-23 ENCOUNTER — Ambulatory Visit
Admission: RE | Admit: 2021-08-23 | Discharge: 2021-08-23 | Disposition: A | Discharge status: Home / Self Care | Payer: BC Managed Care – PPO | Source: Ambulatory Visit | Attending: Family Medicine | Admitting: Family Medicine

## 2021-08-23 DIAGNOSIS — Z1231 Encounter for screening mammogram for malignant neoplasm of breast: Secondary | ICD-10-CM

## 2021-09-05 ENCOUNTER — Other Ambulatory Visit: Payer: Self-pay | Admitting: Family Medicine

## 2021-09-12 ENCOUNTER — Other Ambulatory Visit: Payer: Self-pay | Admitting: Family Medicine

## 2021-11-21 ENCOUNTER — Other Ambulatory Visit: Payer: Self-pay | Admitting: Family Medicine

## 2021-11-21 DIAGNOSIS — E785 Hyperlipidemia, unspecified: Secondary | ICD-10-CM

## 2021-12-13 ENCOUNTER — Other Ambulatory Visit: Payer: Self-pay | Admitting: Family Medicine

## 2022-02-10 ENCOUNTER — Ambulatory Visit (INDEPENDENT_AMBULATORY_CARE_PROVIDER_SITE_OTHER): Payer: BC Managed Care – PPO | Admitting: Family Medicine

## 2022-02-10 ENCOUNTER — Encounter: Payer: Self-pay | Admitting: Family Medicine

## 2022-02-10 VITALS — BP 130/72 | HR 54 | Temp 97.7°F | Resp 16 | Ht 64.5 in | Wt 203.2 lb

## 2022-02-10 DIAGNOSIS — I10 Essential (primary) hypertension: Secondary | ICD-10-CM | POA: Diagnosis not present

## 2022-02-10 DIAGNOSIS — Z23 Encounter for immunization: Secondary | ICD-10-CM

## 2022-02-10 DIAGNOSIS — E785 Hyperlipidemia, unspecified: Secondary | ICD-10-CM

## 2022-02-10 DIAGNOSIS — E669 Obesity, unspecified: Secondary | ICD-10-CM | POA: Diagnosis not present

## 2022-02-10 LAB — CBC WITH DIFFERENTIAL/PLATELET
Basophils Absolute: 0 10*3/uL (ref 0.0–0.1)
Basophils Relative: 0.3 % (ref 0.0–3.0)
Eosinophils Absolute: 0.2 10*3/uL (ref 0.0–0.7)
Eosinophils Relative: 3.2 % (ref 0.0–5.0)
HCT: 41.3 % (ref 36.0–46.0)
Hemoglobin: 14 g/dL (ref 12.0–15.0)
Lymphocytes Relative: 26.8 % (ref 12.0–46.0)
Lymphs Abs: 1.4 10*3/uL (ref 0.7–4.0)
MCHC: 33.9 g/dL (ref 30.0–36.0)
MCV: 92.6 fl (ref 78.0–100.0)
Monocytes Absolute: 0.4 10*3/uL (ref 0.1–1.0)
Monocytes Relative: 8.5 % (ref 3.0–12.0)
Neutro Abs: 3.2 10*3/uL (ref 1.4–7.7)
Neutrophils Relative %: 61.2 % (ref 43.0–77.0)
Platelets: 238 10*3/uL (ref 150.0–400.0)
RBC: 4.46 Mil/uL (ref 3.87–5.11)
RDW: 13 % (ref 11.5–15.5)
WBC: 5.2 10*3/uL (ref 4.0–10.5)

## 2022-02-10 LAB — HEPATIC FUNCTION PANEL
ALT: 19 U/L (ref 0–35)
AST: 19 U/L (ref 0–37)
Albumin: 3.8 g/dL (ref 3.5–5.2)
Alkaline Phosphatase: 75 U/L (ref 39–117)
Bilirubin, Direct: 0.1 mg/dL (ref 0.0–0.3)
Total Bilirubin: 0.5 mg/dL (ref 0.2–1.2)
Total Protein: 6.5 g/dL (ref 6.0–8.3)

## 2022-02-10 LAB — LIPID PANEL
Cholesterol: 199 mg/dL (ref 0–200)
HDL: 60.5 mg/dL (ref >39.00)
LDL Cholesterol: 110 mg/dL — ABNORMAL HIGH (ref 0–99)
NonHDL: 138.94
Total CHOL/HDL Ratio: 3
Triglycerides: 143 mg/dL (ref 0.0–149.0)
VLDL: 28.6 mg/dL (ref 0.0–40.0)

## 2022-02-10 LAB — BASIC METABOLIC PANEL
BUN: 13 mg/dL (ref 6–23)
CO2: 27 mEq/L (ref 19–32)
Calcium: 8.7 mg/dL (ref 8.4–10.5)
Chloride: 103 mEq/L (ref 96–112)
Creatinine, Ser: 0.71 mg/dL (ref 0.40–1.20)
GFR: 88.49 mL/min (ref >60.00)
Glucose, Bld: 96 mg/dL (ref 70–99)
Potassium: 3.9 mEq/L (ref 3.5–5.1)
Sodium: 137 mEq/L (ref 135–145)

## 2022-02-10 LAB — TSH: TSH: 1.86 u[IU]/mL (ref 0.35–5.50)

## 2022-02-10 MED ORDER — MONTELUKAST SODIUM 10 MG PO TABS: ORAL_TABLET | ORAL | 1 refills | Status: DC

## 2022-02-10 MED ORDER — ATORVASTATIN CALCIUM 20 MG PO TABS: ORAL_TABLET | ORAL | 1 refills | Status: DC

## 2022-02-10 MED ORDER — LOSARTAN POTASSIUM 50 MG PO TABS: ORAL_TABLET | ORAL | 1 refills | Status: DC

## 2022-02-10 MED ORDER — PANTOPRAZOLE SODIUM 40 MG PO TBEC: DELAYED_RELEASE_TABLET | ORAL | 1 refills | Status: DC

## 2022-02-10 MED ORDER — ESTRADIOL 0.5 MG PO TABS: 0.5000 mg | ORAL_TABLET | Freq: Every day | ORAL | 0 refills | Status: DC

## 2022-02-10 NOTE — Assessment & Plan Note (Signed)
Chronic problem.  On Lipitor 20mg daily w/o difficulty.  Check labs.  Adjust meds prn  

## 2022-02-10 NOTE — Patient Instructions (Addendum)
Schedule your complete physical in 6 months We'll notify you of your lab results and make any changes if needed Continue to work on healthy diet and regular exercise- you can do it!!! Restart daily Claritin or Zyrtec Call with any questions or concerns Stay Safe!  Stay Healthy!!! Happy Fall!!!

## 2022-02-10 NOTE — Assessment & Plan Note (Signed)
Chronic problem.  On Losartan '50mg'$  daily w/ adequate control.  Currently asymptomatic.  Check labs due to ARB but no anticipated med changes.

## 2022-02-10 NOTE — Assessment & Plan Note (Signed)
Pt has gained 5 lbs since last visit.  She is no longer walking- summer heat, walking partner retired and is no longer getting up in the morning.  Stressed need for healthy diet and regular physical activity.  Check labs to risk stratify.  Will follow.

## 2022-02-10 NOTE — Progress Notes (Signed)
   Subjective:    Patient ID: Bianca Hogan, female    DOB: [birth year], 66 y.o.   MRN: [id number]  HPI Hyperlipidemia- chronic problem, on Lipitor '20mg'$  daily.  No abd pain, N/V  HTN- chronic problem, on Losartan '50mg'$  daily.  No CP, SOB, HAs, visual changes, edema.  Obesity- pt has gained 5 lbs since last visit.  'i'm feeling fat'.  No longer exercising- was previously walking daily.  Review of Systems For ROS see HPI     Objective:   Physical Exam Vitals reviewed.  Constitutional:      General: She is not in acute distress.    Appearance: Normal appearance. She is well-developed. She is obese. She is not ill-appearing.  HENT:     Head: Normocephalic and atraumatic.  Eyes:     Conjunctiva/sclera: Conjunctivae normal.     Pupils: Pupils are equal, round, and reactive to light.  Neck:     Thyroid: No thyromegaly.  Cardiovascular:     Rate and Rhythm: Normal rate and regular rhythm.     Pulses: Normal pulses.     Heart sounds: Normal heart sounds. No murmur heard. Pulmonary:     Effort: Pulmonary effort is normal. No respiratory distress.     Breath sounds: Normal breath sounds.  Abdominal:     General: There is no distension.     Palpations: Abdomen is soft.     Tenderness: There is no abdominal tenderness.  Musculoskeletal:     Cervical back: Normal range of motion and neck supple.     Right lower leg: No edema.     Left lower leg: No edema.  Lymphadenopathy:     Cervical: No cervical adenopathy.  Skin:    General: Skin is warm and dry.  Neurological:     Mental Status: She is alert and oriented to person, place, and time.  Psychiatric:        Behavior: Behavior normal.           Assessment & Plan:

## 2022-02-24 IMAGING — MG DIGITAL SCREENING BILAT W/ TOMO W/ CAD
8 series · 8 of 24 positions shown · non-contrast
Comparison: Previous exam(s).

ACR Breast Density Category a: The breast tissue is almost entirely
fatty.

CLINICAL DATA: Screening.

EXAM:
DIGITAL SCREENING BILATERAL MAMMOGRAM WITH TOMO AND CAD

[L CC synth-2D]
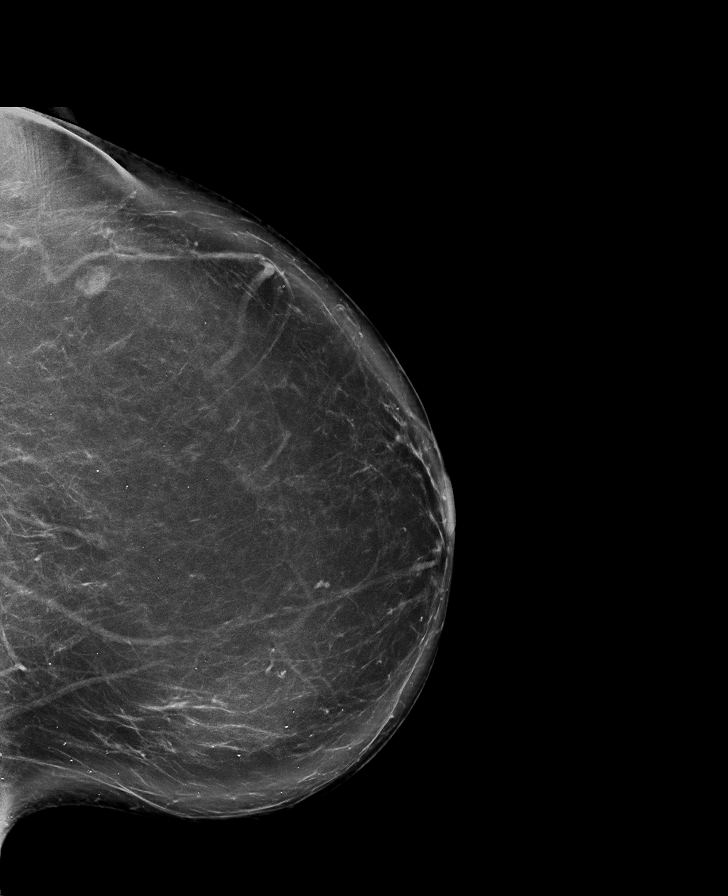

[R MLO synth-2D]
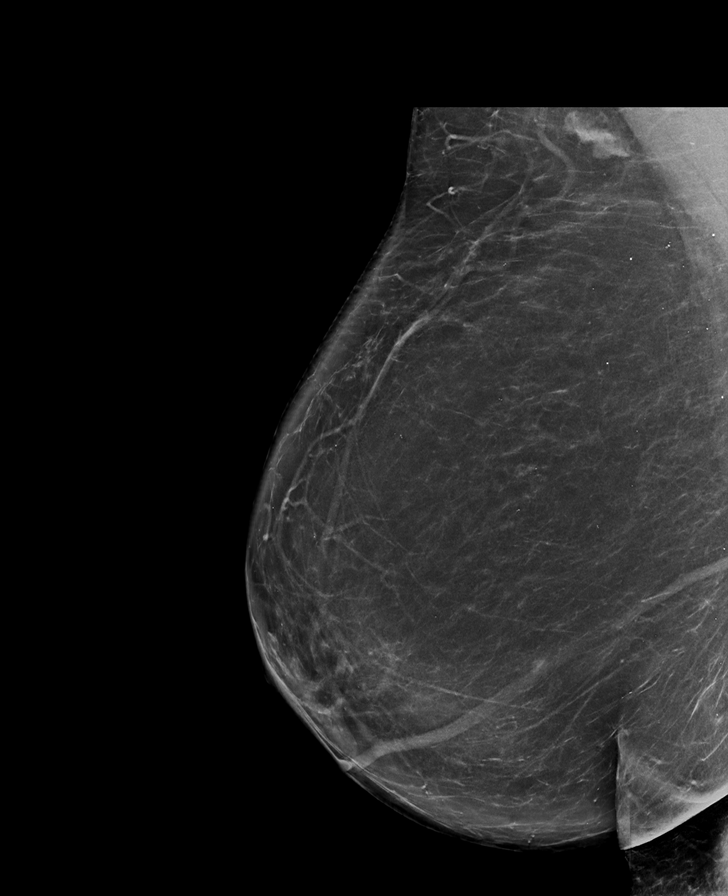

[R CC synth-2D]
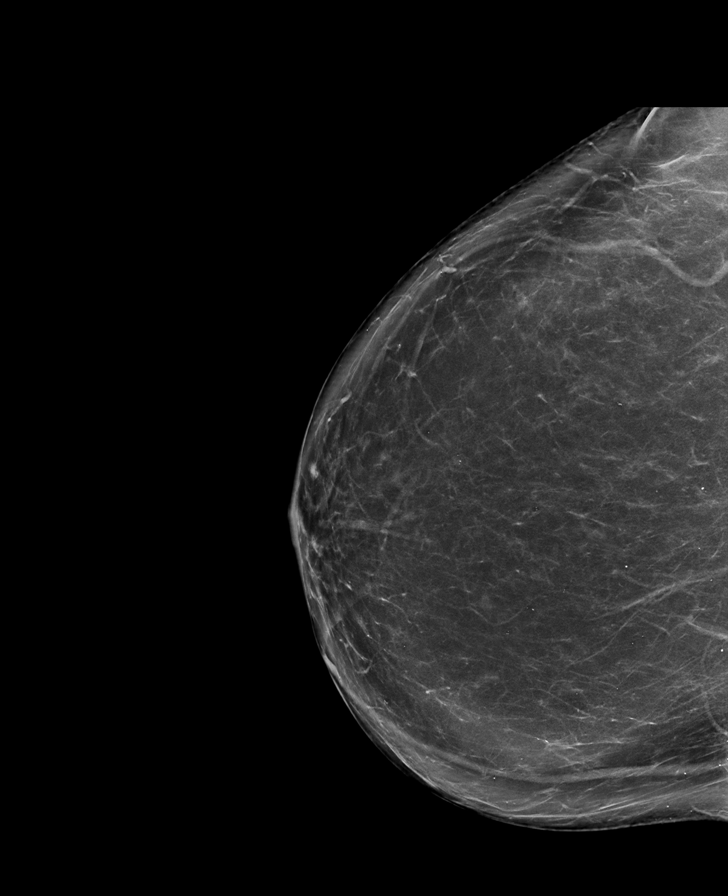

[L MLO synth-2D]
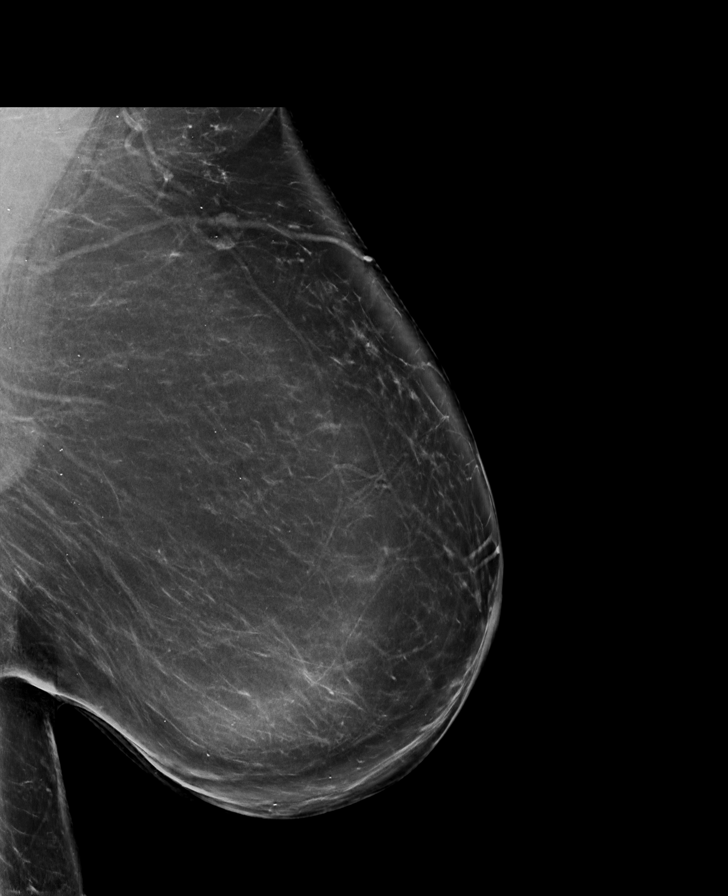

[L MLO tomo · tomo slice 53/106.0]
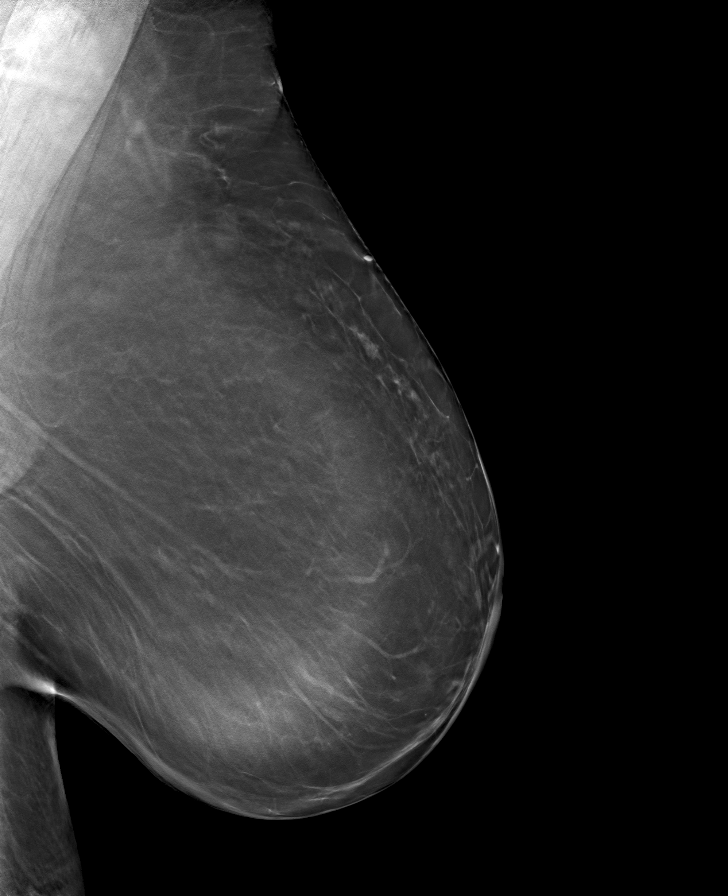

[L CC tomo · tomo slice 51/102.0]
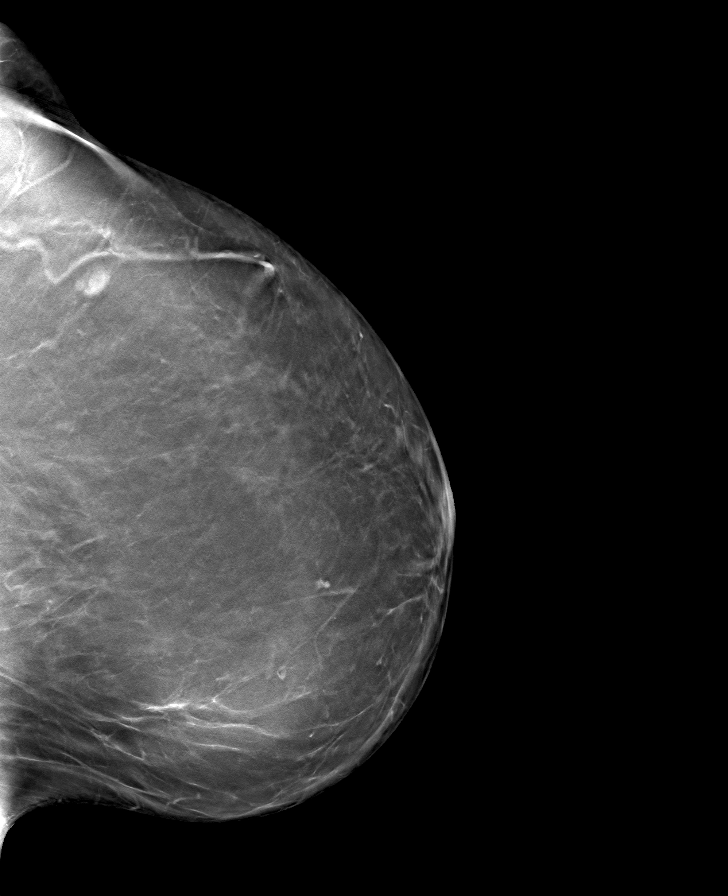

[R CC tomo · tomo slice 46/91.0]
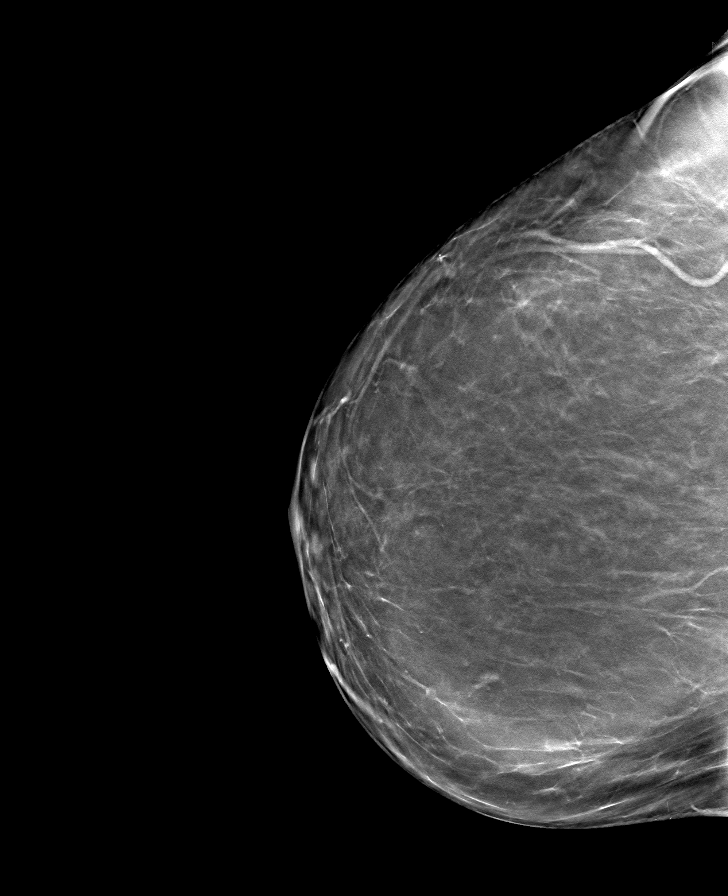

[R MLO tomo · tomo slice 51/102.0]
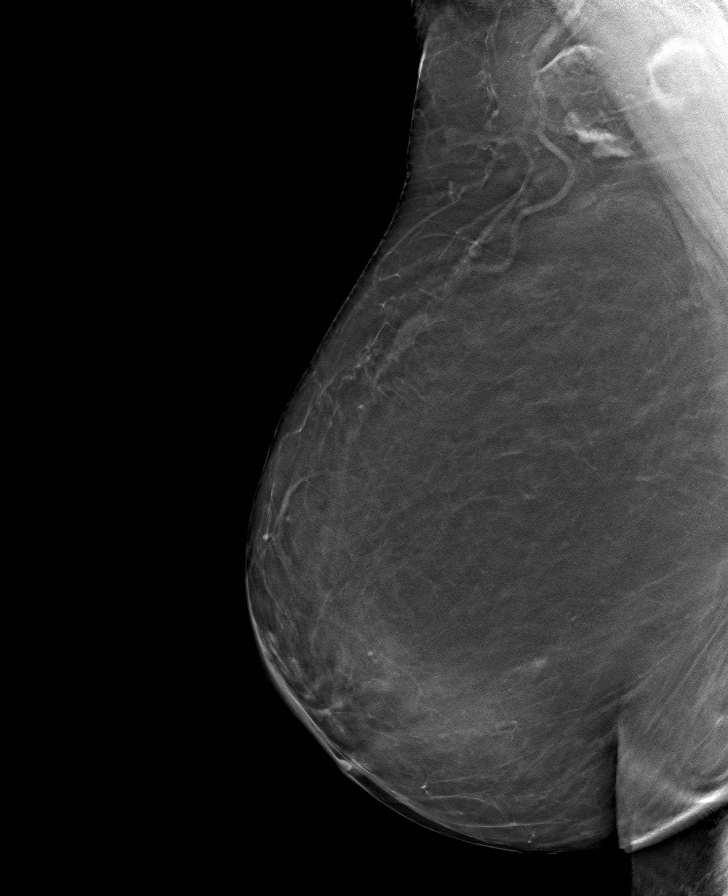

[8 of 24 positions shown; findings below may reference images not displayed]

FINDINGS: There are no findings suspicious for malignancy. Images were
processed with CAD.
IMPRESSION: No mammographic evidence of malignancy. A result letter of this
screening mammogram will be mailed directly to the patient.

RECOMMENDATION:
Screening mammogram in one year. (Code:8Y-Q-VVS)

BI-RADS CATEGORY  1: Negative.

## 2022-05-22 ENCOUNTER — Ambulatory Visit (INDEPENDENT_AMBULATORY_CARE_PROVIDER_SITE_OTHER): Payer: BC Managed Care – PPO

## 2022-05-22 ENCOUNTER — Ambulatory Visit
Admission: EM | Admit: 2022-05-22 | Discharge: 2022-05-22 | Disposition: A | Discharge status: Home / Self Care | Payer: BC Managed Care – PPO | Attending: Urgent Care | Admitting: Urgent Care

## 2022-05-22 DIAGNOSIS — R0989 Other specified symptoms and signs involving the circulatory and respiratory systems: Secondary | ICD-10-CM

## 2022-05-22 DIAGNOSIS — J029 Acute pharyngitis, unspecified: Secondary | ICD-10-CM

## 2022-05-22 DIAGNOSIS — J019 Acute sinusitis, unspecified: Secondary | ICD-10-CM

## 2022-05-22 DIAGNOSIS — J309 Allergic rhinitis, unspecified: Secondary | ICD-10-CM

## 2022-05-22 LAB — POCT RAPID STREP A (OFFICE): Rapid Strep A Screen: NEGATIVE

## 2022-05-22 MED ORDER — AMOXICILLIN 875 MG PO TABS: 875.0000 mg | ORAL_TABLET | Freq: Two times a day (BID) | ORAL | 0 refills | Status: DC

## 2022-05-22 MED ORDER — PSEUDOEPHEDRINE HCL 60 MG PO TABS: 60.0000 mg | ORAL_TABLET | Freq: Three times a day (TID) | ORAL | 0 refills | Status: DC | PRN

## 2022-05-22 MED ORDER — CETIRIZINE HCL 10 MG PO TABS: 10.0000 mg | ORAL_TABLET | Freq: Every day | ORAL | 0 refills | Status: DC

## 2022-05-22 MED ORDER — PROMETHAZINE-DM 6.25-15 MG/5ML PO SYRP: 5.0000 mL | ORAL_SOLUTION | Freq: Three times a day (TID) | ORAL | 0 refills | Status: DC | PRN

## 2022-05-22 NOTE — ED Provider Notes (Signed)
[facility]  Note:  This document was prepared using Systems analyst and may include unintentional dictation errors.  MRN: [id number] DOB: 12/20/1955  Subjective:   Bianca Hogan is a 67 y.o. female presenting for 1 week history of acute onset persistent sinus drainage, throat pain, coughing, chest congestion feels like she is worsening and not improving at all.  Has allergic rhinitis, takes medications for this including Claritin and Singulair.  No asthma.  Patient has a remote history of smoking.  No overt chest pain, shortness of breath or wheezing.   Current Facility-Administered Medications:    0.9 %  sodium chloride infusion, 500 mL, Intravenous, Once, Irene Shipper, MD  Current Outpatient Medications:    aspirin 81 MG tablet, Take 81 mg by mouth daily., Disp: , Rfl:    atorvastatin (LIPITOR) 20 MG tablet, Take 1 tablet by mouth daily., Disp: 90 tablet, Rfl: 1   Cholecalciferol (VITAMIN D) 2000 units tablet, Take 2,000 Units by mouth daily., Disp: , Rfl:    Cyanocobalamin (VITAMIN B-12 PO), Take 1 tablet by mouth daily., Disp: , Rfl:    estradiol (ESTRACE) 0.5 MG tablet, Take 1 tablet (0.5 mg total) by mouth daily., Disp: 90 tablet, Rfl: 0   ferrous sulfate 325 (65 FE) MG tablet, Take 325 mg by mouth daily with breakfast., Disp: , Rfl:    fluticasone (FLONASE) 50 MCG/ACT nasal spray, Place 2 sprays into both nostrils daily., Disp: 16 g, Rfl: 6   Ginkgo Biloba Extract 60 MG CAPS, Take 120 mg by mouth daily., Disp: , Rfl:    Glucosamine-Chondroit-Vit C-Mn CAPS, Take 2 capsules by mouth daily. Takes two  daily, Disp: , Rfl:    KRILL OIL PO, Take 321 mg by mouth., Disp: , Rfl:    losartan (COZAAR) 50 MG tablet, Take 1 tablet by mouth daily., Disp: 90 tablet, Rfl: 1   Lysine HCl 500 MG TABS, Take by mouth., Disp: , Rfl:    meloxicam (MOBIC) 15 MG tablet, Take 1 tablet (15 mg total) by mouth daily as needed for pain., Disp: 30 tablet, Rfl:  0   montelukast (SINGULAIR) 10 MG tablet, TAKE 1 TABLET(10 MG) BY MOUTH AT BEDTIME, Disp: 90 tablet, Rfl: 1   Multiple Vitamins-Minerals (MULTIVITAMIN WITH MINERALS) tablet, Take 1 tablet by mouth daily., Disp: , Rfl:    pantoprazole (PROTONIX) 40 MG tablet, Take 1 tablet by mouth daily., Disp: 90 tablet, Rfl: 1   PREBIOTIC PRODUCT PO, Take by mouth., Disp: , Rfl:    Probiotic Product (PROBIOTIC DAILY PO), Take by mouth., Disp: , Rfl:    valACYclovir (VALTREX) 1000 MG tablet, 2 tabs q12 x2 doses for cold sores prn, Disp: 20 tablet, Rfl: 1   vitamin C (ASCORBIC ACID) 500 MG tablet, Take 500 mg by mouth daily., Disp: , Rfl:    Vitamin D, Ergocalciferol, (DRISDOL) 1.25 MG (50000 UNIT) CAPS capsule, Take 1 capsule (50,000 Units total) by mouth every 7 (seven) days., Disp: 12 capsule, Rfl: 0   Allergies  Allergen Reactions   Eggs Or Egg-Derived Products Diarrhea   Lisinopril Cough   Naproxen Nausea Only    Past Medical History:  Diagnosis Date   Allergy    Bronchitis 01/23/2011   GERD (gastroesophageal reflux disease)    HLD (hyperlipidemia)    Hypertension    Osteoarthritis    Shortness of breath    dyspenea; echo 12/10 EF 55-60%, nml; myoview 12/10 EF 83%, nml     Past  Surgical History:  Procedure Laterality Date   BLADDER SUSPENSION  04/16/2007   chest CT  11/14/2006   pulmonary nodule Rt middle    COLONOSCOPY  12/18/2015   Dr.Perry   CYSTOCELE REPAIR     DILATION AND CURETTAGE OF UTERUS  05/16/1984   miscarriage    POLYPECTOMY     TONSILLECTOMY     VAGINAL HYSTERECTOMY     no cervix    Family History  Problem Relation Age of Onset   Diabetes Mother    Heart disease Mother    Heart disease Father    Diabetes Sister    Heart disease Sister    Diabetes Sister    Heart disease Sister    Heart disease Maternal Grandmother    Heart disease Maternal Grandfather    Heart disease Paternal Grandmother    Heart disease Paternal Grandfather    Cancer Neg Hx    Breast  cancer Neg Hx    Colon cancer Neg Hx    Colon polyps Neg Hx    Esophageal cancer Neg Hx    Rectal cancer Neg Hx    Stomach cancer Neg Hx     Social History   Tobacco Use   Smoking status: Former    Types: Cigarettes    Quit date: 05/25/1987    Years since quitting: 35.0   Smokeless tobacco: Never  Vaping Use   Vaping Use: Never used  Substance Use Topics   Alcohol use: Yes    Alcohol/week: 10.0 standard drinks of alcohol    Types: 10 Glasses of wine per week    Comment: 1-2 beers daily   Drug use: No    ROS   Objective:   Vitals: BP (!) 156/91 (BP Location: Left Arm)   Pulse 68   Temp 98 F (36.7 C) (Oral)   Resp 18   Ht '5\' 4"'$  (1.626 m)   Wt 195 lb (88.5 kg)   SpO2 97%   BMI 33.47 kg/m   Physical Exam Constitutional:      General: She is not in acute distress.    Appearance: Normal appearance. She is well-developed and normal weight. She is not ill-appearing, toxic-appearing or diaphoretic.  HENT:     Head: Normocephalic and atraumatic.     Right Ear: Tympanic membrane, ear canal and external ear normal. No drainage or tenderness. No middle ear effusion. There is no impacted cerumen. Tympanic membrane is not erythematous or bulging.     Left Ear: Tympanic membrane, ear canal and external ear normal. No drainage or tenderness.  No middle ear effusion. There is no impacted cerumen. Tympanic membrane is not erythematous or bulging.     Nose: Congestion present. No rhinorrhea.     Mouth/Throat:     Mouth: Mucous membranes are moist. No oral lesions.     Pharynx: Posterior oropharyngeal erythema (with thick postnasal drainage overlying pharynx) present. No pharyngeal swelling, oropharyngeal exudate or uvula swelling.     Tonsils: No tonsillar exudate or tonsillar abscesses.  Eyes:     General: No scleral icterus.       Right eye: No discharge.        Left eye: No discharge.     Extraocular Movements: Extraocular movements intact.     Right eye: Normal extraocular  motion.     Left eye: Normal extraocular motion.     Conjunctiva/sclera: Conjunctivae normal.  Cardiovascular:     Rate and Rhythm: Normal rate and regular rhythm.  Heart sounds: Normal heart sounds. No murmur heard.    No friction rub. No gallop.  Pulmonary:     Effort: Pulmonary effort is normal. No respiratory distress.     Breath sounds: No stridor. No wheezing, rhonchi or rales.  Chest:     Chest wall: No tenderness.  Musculoskeletal:     Cervical back: Normal range of motion and neck supple.  Lymphadenopathy:     Cervical: No cervical adenopathy.  Skin:    General: Skin is warm and dry.  Neurological:     General: No focal deficit present.     Mental Status: She is alert and oriented to person, place, and time.  Psychiatric:        Mood and Affect: Mood normal.        Behavior: Behavior normal.     Results for orders placed or performed during the hospital encounter of 05/22/22 (from the past 24 hour(s))  POCT rapid strep A     Status: None   Collection Time: 05/22/22  9:32 AM  Result Value Ref Range   Rapid Strep A Screen Negative Negative   DG Chest 2 View  Result Date: 05/22/2022 CLINICAL DATA:  Chest congestion EXAM: CHEST - 2 VIEW COMPARISON:  None Available. FINDINGS: The heart size and mediastinal contours are within normal limits. Both lungs are clear. The visualized skeletal structures are unremarkable. IMPRESSION: No active cardiopulmonary disease. Electronically Signed   By: Dorise Bullion III M.D.   On: 05/22/2022 09:38     Assessment and Plan :   PDMP not reviewed this encounter.  1. Acute sinusitis, recurrence not specified, unspecified location   2. Chest congestion   3. Allergic rhinitis, unspecified seasonality, unspecified trigger     Will defer steroid use for now.  Strep is negative but as I am addressing her symptoms for sinusitis will defer strep culture.  Recommend supportive care.  Chest x-ray negative. Counseled patient on potential for  adverse effects with medications prescribed/recommended today, ER and return-to-clinic precautions discussed, patient verbalized understanding.    Jaynee Eagles, [place] 05/22/22 [phone]

## 2022-05-22 NOTE — ED Triage Notes (Signed)
Pt states that she has a sore throat, cough and chest congestion. X1 week

## 2022-06-30 ENCOUNTER — Other Ambulatory Visit: Payer: Self-pay | Admitting: Family Medicine

## 2022-07-12 ENCOUNTER — Other Ambulatory Visit: Payer: Self-pay | Admitting: Family Medicine

## 2022-07-12 DIAGNOSIS — Z1231 Encounter for screening mammogram for malignant neoplasm of breast: Secondary | ICD-10-CM

## 2022-08-16 ENCOUNTER — Ambulatory Visit (INDEPENDENT_AMBULATORY_CARE_PROVIDER_SITE_OTHER): Payer: BC Managed Care – PPO | Admitting: Family Medicine

## 2022-08-16 ENCOUNTER — Encounter: Payer: Self-pay | Admitting: Family Medicine

## 2022-08-16 VITALS — BP 130/86 | HR 82 | Temp 98.2°F | Resp 17 | Ht 64.0 in | Wt 201.0 lb

## 2022-08-16 DIAGNOSIS — Z0001 Encounter for general adult medical examination with abnormal findings: Secondary | ICD-10-CM

## 2022-08-16 DIAGNOSIS — R0981 Nasal congestion: Secondary | ICD-10-CM | POA: Diagnosis not present

## 2022-08-16 DIAGNOSIS — E559 Vitamin D deficiency, unspecified: Secondary | ICD-10-CM | POA: Diagnosis not present

## 2022-08-16 DIAGNOSIS — Z0000 Encounter for general adult medical examination without abnormal findings: Secondary | ICD-10-CM

## 2022-08-16 DIAGNOSIS — I10 Essential (primary) hypertension: Secondary | ICD-10-CM | POA: Diagnosis not present

## 2022-08-16 DIAGNOSIS — R413 Other amnesia: Secondary | ICD-10-CM | POA: Diagnosis not present

## 2022-08-16 DIAGNOSIS — U071 COVID-19: Secondary | ICD-10-CM | POA: Diagnosis not present

## 2022-08-16 LAB — HEPATIC FUNCTION PANEL
ALT: 21 U/L (ref 0–35)
AST: 22 U/L (ref 0–37)
Albumin: 4 g/dL (ref 3.5–5.2)
Alkaline Phosphatase: 87 U/L (ref 39–117)
Bilirubin, Direct: 0.1 mg/dL (ref 0.0–0.3)
Total Bilirubin: 0.4 mg/dL (ref 0.2–1.2)
Total Protein: 7 g/dL (ref 6.0–8.3)

## 2022-08-16 LAB — CBC WITH DIFFERENTIAL/PLATELET
Basophils Absolute: 0 10*3/uL (ref 0.0–0.1)
Basophils Relative: 0.2 % (ref 0.0–3.0)
Eosinophils Absolute: 0 10*3/uL (ref 0.0–0.7)
Eosinophils Relative: 0.7 % (ref 0.0–5.0)
HCT: 43.3 % (ref 36.0–46.0)
Hemoglobin: 15 g/dL (ref 12.0–15.0)
Lymphocytes Relative: 8.6 % — ABNORMAL LOW (ref 12.0–46.0)
Lymphs Abs: 0.5 10*3/uL — ABNORMAL LOW (ref 0.7–4.0)
MCHC: 34.6 g/dL (ref 30.0–36.0)
MCV: 91.9 fl (ref 78.0–100.0)
Monocytes Absolute: 0.6 10*3/uL (ref 0.1–1.0)
Monocytes Relative: 10.2 % (ref 3.0–12.0)
Neutro Abs: 5 10*3/uL (ref 1.4–7.7)
Neutrophils Relative %: 80.3 % — ABNORMAL HIGH (ref 43.0–77.0)
Platelets: 230 10*3/uL (ref 150.0–400.0)
RBC: 4.71 Mil/uL (ref 3.87–5.11)
RDW: 13 % (ref 11.5–15.5)
WBC: 6.3 10*3/uL (ref 4.0–10.5)

## 2022-08-16 LAB — BASIC METABOLIC PANEL
BUN: 10 mg/dL (ref 6–23)
CO2: 26 mEq/L (ref 19–32)
Calcium: 8.8 mg/dL (ref 8.4–10.5)
Chloride: 102 mEq/L (ref 96–112)
Creatinine, Ser: 0.74 mg/dL (ref 0.40–1.20)
GFR: 83.9 mL/min (ref >60.00)
Glucose, Bld: 102 mg/dL — ABNORMAL HIGH (ref 70–99)
Potassium: 3.9 mEq/L (ref 3.5–5.1)
Sodium: 135 mEq/L (ref 135–145)

## 2022-08-16 LAB — LIPID PANEL
Cholesterol: 218 mg/dL — ABNORMAL HIGH (ref 0–200)
HDL: 61.8 mg/dL (ref >39.00)
LDL Cholesterol: 127 mg/dL — ABNORMAL HIGH (ref 0–99)
NonHDL: 156.11
Total CHOL/HDL Ratio: 4
Triglycerides: 145 mg/dL (ref 0.0–149.0)
VLDL: 29 mg/dL (ref 0.0–40.0)

## 2022-08-16 LAB — TSH: TSH: 1.09 u[IU]/mL (ref 0.35–5.50)

## 2022-08-16 LAB — VITAMIN D 25 HYDROXY (VIT D DEFICIENCY, FRACTURES): VITD: 35.17 ng/mL (ref 30.00–100.00)

## 2022-08-16 LAB — POC COVID19 BINAXNOW: SARS Coronavirus 2 Ag: POSITIVE — AB

## 2022-08-16 MED ORDER — NIRMATRELVIR/RITONAVIR (PAXLOVID)TABLET: 3.0000 | ORAL_TABLET | Freq: Two times a day (BID) | ORAL | 0 refills | Status: AC

## 2022-08-16 NOTE — Patient Instructions (Addendum)
Follow up in 4-6 weeks to recheck blood pressure We'll notify you of your lab results and make any changes if needed Continue to work on low carb/low sugar diet and regular physical activity START the Paxlovid as directed.  HOLD the Atorvastatin while on the medication and restart when finished Monitor/log any memory symptoms so we can discuss at next visit Call with any questions or concerns Happy [place]!!

## 2022-08-16 NOTE — Assessment & Plan Note (Signed)
Check labs and replete prn. 

## 2022-08-16 NOTE — Assessment & Plan Note (Signed)
Chronic problem.  BP is mildly elevated today- particularly the diastolic value.  She currently isn't feeling well and this may be the cause.  Will hold on increasing Losartan and have her return in 4-6 weeks to reassess.  Pt expressed understanding and is in agreement w/ plan.

## 2022-08-16 NOTE — Assessment & Plan Note (Signed)
Pt's PE WNL w/ exception of BMI and currently viral URI.  UTD on colonoscopy, Tdap, PNA, mammo.  Check labs.  Anticipatory guidance provided.

## 2022-08-16 NOTE — Progress Notes (Signed)
Subjective:    Patient ID: Bianca Hogan, female    DOB: 06/04/1955, 67 y.o.   MRN: [id number]  HPI CPE- UTD on colonoscopy, Tdap, PNA, mammo  Patient Care Team    Relationship Specialty Notifications Start End  Midge Minium, MD PCP - General Family Medicine  09/28/11   Irene Shipper, MD Consulting Physician Gastroenterology  09/22/14      Health Maintenance  Topic Date Due   MAMMOGRAM  08/24/2022   INFLUENZA VACCINE  12/15/2022   COLONOSCOPY (Pts 45-21yrs Insurance coverage will need to be confirmed)  02/19/2026   DTaP/Tdap/Td (3 - Td or Tdap) 01/23/2030   Pneumonia Vaccine 70+ Years old  Completed   DEXA SCAN  Completed   Hepatitis C Screening  Completed   Zoster Vaccines- Shingrix  Completed   HPV VACCINES  Aged Out   COVID-19 Vaccine  Discontinued      Review of Systems Patient reports no vision/ hearing changes, adenopathy,fever, persistant/recurrent hoarseness , swallowing issues, chest pain, palpitations, edema, persistant/recurrent cough, hemoptysis, dyspnea (rest/exertional/paroxysmal nocturnal), gastrointestinal bleeding (melena, rectal bleeding), abdominal pain, significant heartburn, bowel changes, GU symptoms (dysuria, hematuria, incontinence), Gyn symptoms (abnormal  bleeding, pain),  syncope, focal weakness, numbness & tingling, skin/hair/nail changes, abnormal bruising or bleeding, anxiety, or depression.    URI- sxs started Sunday. No fever.  + body aches.  Was visiting granddaughter in hospital last week.  + hoarseness, cough.  + nasal congestion.  Denies HA.  + ear fullness.  Husband w/ similar sxs.  Took AlkaSeltzer Cold + last night.  On Claritin and Singulair.  + memory loss- pt reports sxs for the last month.  Yesterday noted that she had difficulty pushing the correct buttons on the car.  Has been under considerable stress w/ granddaughter's recent illness and hospitalization.      Objective:   Physical Exam General Appearance:    Alert,  cooperative, no distress, appears stated age  Head:    Normocephalic, without obvious abnormality, atraumatic  Eyes:    PERRL, conjunctiva/corneas clear, EOM's intact both eyes  Ears:    Normal TM's and external ear canals, both ears  Nose:   Nares normal, septum midline, + congestion  Throat:   Lips, mucosa, and tongue normal; teeth and gums normal  Neck:   Supple, symmetrical, trachea midline, no adenopathy;    Thyroid: no enlargement/tenderness/nodules  Back:     Symmetric, no curvature, ROM normal, no CVA tenderness  Lungs:     Clear to auscultation bilaterally, respirations unlabored  Chest Wall:    No tenderness or deformity   Heart:    Regular rate and rhythm, S1 and S2 normal, no murmur, rub   or gallop  Breast Exam:    Deferred to GYN  Abdomen:     Soft, non-tender, bowel sounds active all four quadrants,    no masses, no organomegaly  Genitalia:    Deferred to GYN  Rectal:    Extremities:   Extremities normal, atraumatic, no cyanosis or edema  Pulses:   2+ and symmetric all extremities  Skin:   Skin color, texture, turgor normal, no rashes or lesions  Lymph nodes:   Cervical, supraclavicular, and axillary nodes normal  Neurologic:   CNII-XII intact, normal strength, sensation and reflexes    throughout          Assessment & Plan:   COVID- new.  Pt's sxs were suspcious for infxn and rapid test confirmed.  Given her age and medical conditions,  will start Paxlovid.  Pt aware she needs to hold Atorvastatin will on antiviral medication.  Reviewed supportive care and red flags that should prompt return.  Pt expressed understanding and is in agreement w/ plan.

## 2022-08-16 NOTE — Assessment & Plan Note (Signed)
New.  Pt reports yesterday she had a hard time understanding what buttons to push on her car.  Did discuss that COVID can cause brain fog and since she currently is sick, this could explain some of her confusion.  She has also been under considerable stress w/ her 59 month old grandchild in the hospital.  She is coming back in 4-6 weeks to recheck BP so I encouraged her to log her sxs and see if they change/improve over time as she gets over [place].  Pt expressed understanding and is in agreement w/ plan.

## 2022-08-17 ENCOUNTER — Telehealth: Payer: Self-pay

## 2022-08-17 NOTE — Telephone Encounter (Signed)
Pt seen results Via my chart  

## 2022-08-17 NOTE — Telephone Encounter (Signed)
-----   Message from Midge Minium, MD sent at 08/17/2022  7:34 AM EDT ----- Labs look good!  Total cholesterol and LDL (bad cholesterol) have both increased somewhat but this will improve w/ healthy diet, regular exercise, and regular use of Atorvastatin (once you are done w/ the Paxlovid).  No changes at this time

## 2022-08-25 ENCOUNTER — Ambulatory Visit
Admission: RE | Admit: 2022-08-25 | Discharge: 2022-08-25 | Disposition: A | Discharge status: Home / Self Care | Payer: BC Managed Care – PPO | Source: Ambulatory Visit | Attending: Family Medicine | Admitting: Family Medicine

## 2022-08-25 DIAGNOSIS — Z1231 Encounter for screening mammogram for malignant neoplasm of breast: Secondary | ICD-10-CM

## 2022-09-05 ENCOUNTER — Other Ambulatory Visit: Payer: Self-pay | Admitting: Family Medicine

## 2022-09-12 ENCOUNTER — Other Ambulatory Visit: Payer: Self-pay

## 2022-09-12 MED ORDER — ESTRADIOL 0.5 MG PO TABS: 0.5000 mg | ORAL_TABLET | Freq: Every day | ORAL | 1 refills | Status: DC

## 2022-09-15 ENCOUNTER — Ambulatory Visit (INDEPENDENT_AMBULATORY_CARE_PROVIDER_SITE_OTHER): Payer: BC Managed Care – PPO | Admitting: Family Medicine

## 2022-09-15 ENCOUNTER — Encounter: Payer: Self-pay | Admitting: Family Medicine

## 2022-09-15 VITALS — BP 126/70 | HR 79 | Temp 97.9°F | Ht 64.0 in | Wt 204.8 lb

## 2022-09-15 DIAGNOSIS — H9193 Unspecified hearing loss, bilateral: Secondary | ICD-10-CM | POA: Insufficient documentation

## 2022-09-15 DIAGNOSIS — I10 Essential (primary) hypertension: Secondary | ICD-10-CM

## 2022-09-15 NOTE — Patient Instructions (Signed)
Follow up in 6 months to recheck BP and cholesterol We'll call you with your audiology appt Your blood pressure looks great!  No med changes at this time Call with any questions or concerns HAPPY RETIREMENT!!!

## 2022-09-15 NOTE — Progress Notes (Signed)
   Subjective:    Patient ID: Bianca Hogan, female    DOB: 11/07/1955, 67 y.o.   MRN: [id number]  HPI HTN- chronic problem, on Losartan 50mg  daily.  BP is much better controlled today.  Pt reports feeling much better than last visit.  No CP, SOB, HA's, visual changes, edema.  Hearing loss- bilateral.  Others are now commenting on hearing loss.  Pt reports at times she can hear things but not understand.     Review of Systems For ROS see HPI     Objective:   Physical Exam Constitutional:      General: She is not in acute distress.    Appearance: She is well-developed.  HENT:     Head: Normocephalic and atraumatic.     Right Ear: Tympanic membrane and ear canal normal.     Left Ear: Tympanic membrane and ear canal normal.  Eyes:     Conjunctiva/sclera: Conjunctivae normal.     Pupils: Pupils are equal, round, and reactive to light.  Neck:     Thyroid: No thyromegaly.  Cardiovascular:     Rate and Rhythm: Normal rate and regular rhythm.     Pulses: Normal pulses.     Heart sounds: Normal heart sounds. No murmur heard. Pulmonary:     Effort: Pulmonary effort is normal. No respiratory distress.     Breath sounds: Normal breath sounds.  Abdominal:     General: There is no distension.     Palpations: Abdomen is soft.     Tenderness: There is no abdominal tenderness.  Musculoskeletal:     Cervical back: Normal range of motion and neck supple.     Right lower leg: No edema.     Left lower leg: No edema.  Lymphadenopathy:     Cervical: No cervical adenopathy.  Skin:    General: Skin is warm and dry.  Neurological:     Mental Status: She is alert and oriented to person, place, and time.  Psychiatric:        Behavior: Behavior normal.           Assessment & Plan:

## 2022-10-02 NOTE — Assessment & Plan Note (Signed)
New to provider, ongoing for pt.  Family and friends are now commenting on extent of hearing loss.  Will refer to Audiology for a complete evaluation.  Pt expressed understanding and is in agreement w/ plan.

## 2022-10-02 NOTE — Assessment & Plan Note (Signed)
Ongoing issue for pt.  Currently on Losartan 50mg  daily w/ good BP control.  Currently asymptomatic and feeling better than at last visit.  No changes at this time.

## 2022-12-20 ENCOUNTER — Other Ambulatory Visit: Payer: Self-pay | Admitting: Family Medicine

## 2022-12-20 DIAGNOSIS — E785 Hyperlipidemia, unspecified: Secondary | ICD-10-CM

## 2023-01-20 ENCOUNTER — Other Ambulatory Visit: Payer: Self-pay | Admitting: Family Medicine

## 2023-02-20 ENCOUNTER — Other Ambulatory Visit: Payer: Self-pay | Admitting: Family Medicine

## 2023-03-20 ENCOUNTER — Ambulatory Visit (INDEPENDENT_AMBULATORY_CARE_PROVIDER_SITE_OTHER): Payer: Managed Care, Other (non HMO) | Admitting: Family Medicine

## 2023-03-20 ENCOUNTER — Encounter: Payer: Self-pay | Admitting: Family Medicine

## 2023-03-20 VITALS — BP 128/80 | HR 78 | Temp 97.8°F | Ht 64.0 in | Wt 207.5 lb

## 2023-03-20 DIAGNOSIS — I10 Essential (primary) hypertension: Secondary | ICD-10-CM

## 2023-03-20 DIAGNOSIS — E669 Obesity, unspecified: Secondary | ICD-10-CM

## 2023-03-20 DIAGNOSIS — E785 Hyperlipidemia, unspecified: Secondary | ICD-10-CM

## 2023-03-20 DIAGNOSIS — Z23 Encounter for immunization: Secondary | ICD-10-CM

## 2023-03-20 LAB — CBC WITH DIFFERENTIAL/PLATELET
Basophils Absolute: 0 10*3/uL (ref 0.0–0.1)
Basophils Relative: 0.4 % (ref 0.0–3.0)
Eosinophils Absolute: 0.1 10*3/uL (ref 0.0–0.7)
Eosinophils Relative: 2.4 % (ref 0.0–5.0)
HCT: 45.9 % (ref 36.0–46.0)
Hemoglobin: 15.1 g/dL — ABNORMAL HIGH (ref 12.0–15.0)
Lymphocytes Relative: 26.6 % (ref 12.0–46.0)
Lymphs Abs: 1.6 10*3/uL (ref 0.7–4.0)
MCHC: 32.9 g/dL (ref 30.0–36.0)
MCV: 94.9 fL (ref 78.0–100.0)
Monocytes Absolute: 0.5 10*3/uL (ref 0.1–1.0)
Monocytes Relative: 8.4 % (ref 3.0–12.0)
Neutro Abs: 3.8 10*3/uL (ref 1.4–7.7)
Neutrophils Relative %: 62.2 % (ref 43.0–77.0)
Platelets: 275 10*3/uL (ref 150.0–400.0)
RBC: 4.83 Mil/uL (ref 3.87–5.11)
RDW: 12.6 % (ref 11.5–15.5)
WBC: 6.1 10*3/uL (ref 4.0–10.5)

## 2023-03-20 LAB — BASIC METABOLIC PANEL
BUN: 13 mg/dL (ref 6–23)
CO2: 27 meq/L (ref 19–32)
Calcium: 9.3 mg/dL (ref 8.4–10.5)
Chloride: 102 meq/L (ref 96–112)
Creatinine, Ser: 0.75 mg/dL (ref 0.40–1.20)
GFR: 82.22 mL/min (ref >60.00)
Glucose, Bld: 99 mg/dL (ref 70–99)
Potassium: 4.3 meq/L (ref 3.5–5.1)
Sodium: 138 meq/L (ref 135–145)

## 2023-03-20 LAB — HEPATIC FUNCTION PANEL
ALT: 25 U/L (ref 0–35)
AST: 22 U/L (ref 0–37)
Albumin: 4 g/dL (ref 3.5–5.2)
Alkaline Phosphatase: 91 U/L (ref 39–117)
Bilirubin, Direct: 0.1 mg/dL (ref 0.0–0.3)
Total Bilirubin: 0.5 mg/dL (ref 0.2–1.2)
Total Protein: 7 g/dL (ref 6.0–8.3)

## 2023-03-20 LAB — TSH: TSH: 1.53 u[IU]/mL (ref 0.35–5.50)

## 2023-03-20 LAB — LIPID PANEL
Cholesterol: 228 mg/dL — ABNORMAL HIGH (ref 0–200)
HDL: 64.1 mg/dL (ref >39.00)
LDL Cholesterol: 135 mg/dL — ABNORMAL HIGH (ref 0–99)
NonHDL: 163.59
Total CHOL/HDL Ratio: 4
Triglycerides: 141 mg/dL (ref 0.0–149.0)
VLDL: 28.2 mg/dL (ref 0.0–40.0)

## 2023-03-20 NOTE — Patient Instructions (Signed)
Schedule your complete physical in 6 months We'll notify you of your lab results and make any changes if needed Continue to work on healthy diet and regular exercise- you can do it!!! Call with any questions or concerns Stay Safe!  Stay Healthy! Enjoy your travels!!!

## 2023-03-20 NOTE — Assessment & Plan Note (Signed)
Ongoing issue for pt.  She has joined [employer] and is exercising regularly.  Admits to eating poorly but just restarted [employer] today.  Will continue to follow.

## 2023-03-20 NOTE — Progress Notes (Signed)
   Subjective:    Patient ID: Bianca Hogan, female    DOB: Dec 12, 1955, 67 y.o.   MRN: [id number]  HPI HTN- chronic problem, on Losartan 50mg  daily.  No CP, SOB, HA's, visual changes, edema.  Hyperlipidemia- chronic problem, on Lipitor 20mg  daily.  No abd pain, N/V.  Obesity- ongoing issue for pt.  BMI 35.62  Just restarted [employer].  [employer]- now doing Body [employer], Water Aerobics, Yoga.     Review of Systems For ROS see HPI     Objective:   Physical Exam Vitals reviewed.  Constitutional:      General: She is not in acute distress.    Appearance: Normal appearance. She is well-developed. She is obese. She is not ill-appearing.  HENT:     Head: Normocephalic and atraumatic.  Eyes:     Conjunctiva/sclera: Conjunctivae normal.     Pupils: Pupils are equal, round, and reactive to light.  Neck:     Thyroid: No thyromegaly.  Cardiovascular:     Rate and Rhythm: Normal rate and regular rhythm.     Pulses: Normal pulses.     Heart sounds: Normal heart sounds. No murmur heard. Pulmonary:     Effort: Pulmonary effort is normal. No respiratory distress.     Breath sounds: Normal breath sounds.  Abdominal:     General: There is no distension.     Palpations: Abdomen is soft.     Tenderness: There is no abdominal tenderness.  Musculoskeletal:     Cervical back: Normal range of motion and neck supple.     Right lower leg: No edema.     Left lower leg: No edema.  Lymphadenopathy:     Cervical: No cervical adenopathy.  Skin:    General: Skin is warm and dry.  Neurological:     General: No focal deficit present.     Mental Status: She is alert and oriented to person, place, and time.  Psychiatric:        Mood and Affect: Mood normal.        Behavior: Behavior normal.           Assessment & Plan:

## 2023-03-20 NOTE — Assessment & Plan Note (Signed)
Chronic problem.  On Lipitor 20mg daily.  Currently asymptomatic.  Check labs.  Adjust meds prn  °

## 2023-03-20 NOTE — Assessment & Plan Note (Signed)
Chronic problem.  On Losartan 50mg  daily.  Currently asymptomatic.  Check labs due to ARB use but no anticipated med changes.  Will follow.

## 2023-04-12 ENCOUNTER — Telehealth: Payer: Self-pay | Admitting: Family Medicine

## 2023-04-12 NOTE — Telephone Encounter (Signed)
Noted  

## 2023-04-12 NOTE — Telephone Encounter (Signed)
Form reviewed. No action needed.

## 2023-04-12 NOTE — Telephone Encounter (Signed)
Placed in folder at [employer].

## 2023-04-12 NOTE — Telephone Encounter (Signed)
Type of form received:[employer] Well Managed   Additional comments: [employer] [place] Losartan Potass 50mg  Tab   Received NU:[id number] - Front Desk   Form should be Faxed/mailed to: (address/ fax #) N/A  Is patient requesting call for pickup:N/A  Form placed:  [employer] charge sheet.  Provider will determine charge.N/A  Individual made aware of 3-5 business day turn around No?

## 2023-04-21 ENCOUNTER — Other Ambulatory Visit: Payer: Self-pay | Admitting: Family Medicine

## 2023-04-21 DIAGNOSIS — E785 Hyperlipidemia, unspecified: Secondary | ICD-10-CM

## 2023-05-25 ENCOUNTER — Encounter: Payer: Self-pay | Admitting: Family Medicine

## 2023-05-25 NOTE — Telephone Encounter (Signed)
**Note De-identified  [place] Obfuscation** Please advise 

## 2023-05-26 MED ORDER — NIRMATRELVIR/RITONAVIR (PAXLOVID)TABLET: 3.0000 | ORAL_TABLET | Freq: Two times a day (BID) | ORAL | 0 refills | Status: AC

## 2023-05-26 NOTE — Addendum Note (Signed)
 Addended by: Sheliah Hatch on: 05/26/2023 07:31 AM   Modules accepted: Orders

## 2023-05-31 ENCOUNTER — Encounter: Payer: Self-pay | Admitting: Family Medicine

## 2023-05-31 NOTE — Telephone Encounter (Signed)
 Can patient get this prescribed without an appointment?

## 2023-06-01 MED ORDER — CLOTRIMAZOLE-BETAMETHASONE 1-0.05 % EX CREA: 1.0000 | TOPICAL_CREAM | Freq: Two times a day (BID) | CUTANEOUS | 3 refills | Status: DC

## 2023-06-23 ENCOUNTER — Telehealth: Payer: Self-pay | Admitting: Family Medicine

## 2023-06-23 NOTE — Telephone Encounter (Signed)
 Called patient to see if she wanted to be seen today instead of waiting. Patient is currently on a cruise and is flying back to [place] over the weekend so she needs to keep her current appointment.

## 2023-06-26 ENCOUNTER — Ambulatory Visit (INDEPENDENT_AMBULATORY_CARE_PROVIDER_SITE_OTHER): Payer: Managed Care, Other (non HMO) | Admitting: Family Medicine

## 2023-06-26 ENCOUNTER — Encounter: Payer: Self-pay | Admitting: Family Medicine

## 2023-06-26 VITALS — BP 128/62 | HR 65 | Temp 98.7°F | Ht 64.0 in | Wt 200.0 lb

## 2023-06-26 DIAGNOSIS — H9211 Otorrhea, right ear: Secondary | ICD-10-CM

## 2023-06-26 DIAGNOSIS — J069 Acute upper respiratory infection, unspecified: Secondary | ICD-10-CM

## 2023-06-26 MED ORDER — AMOXICILLIN 875 MG PO TABS: 875.0000 mg | ORAL_TABLET | Freq: Two times a day (BID) | ORAL | 0 refills | Status: AC

## 2023-06-26 NOTE — Progress Notes (Signed)
   Subjective:    Patient ID: Bianca Hogan, female    DOB: 01-Aug-1955, 68 y.o.   MRN: [id number]  HPI Ear pain- pt reports she had ear fullness and pressure on Wednesday.  Got OTC Zpack in [place] (this is what she was told, she's not sure).  Thursday night did not have pain but noticed that ear was 'really wet'.  No improvement in hearing.  Ear was popping 'a lot' on the flight home.  + increased drainage from R ear.  No issue w/ L ear.  + nasal congestion and cough.   Review of Systems For ROS see HPI     Objective:   Physical Exam Vitals reviewed.  Constitutional:      General: She is not in acute distress.    Appearance: Normal appearance. She is not ill-appearing.  HENT:     Head: Normocephalic and atraumatic.     Right Ear: Drainage present. No middle ear effusion. Tympanic membrane is erythematous and retracted.     Left Ear: No drainage.  No middle ear effusion. Tympanic membrane is not erythematous or retracted.     Nose: Congestion present. No rhinorrhea.     Comments: No TTP over frontal or maxillary sinuses Eyes:     Extraocular Movements: Extraocular movements intact.     Conjunctiva/sclera: Conjunctivae normal.  Cardiovascular:     Rate and Rhythm: Normal rate and regular rhythm.  Pulmonary:     Effort: Pulmonary effort is normal. No respiratory distress.     Breath sounds: No wheezing or rhonchi.  Musculoskeletal:     Cervical back: Neck supple.  Lymphadenopathy:     Cervical: No cervical adenopathy.  Neurological:     General: No focal deficit present.     Mental Status: She is alert and oriented to person, place, and time.  Psychiatric:        Mood and Affect: Mood normal.        Behavior: Behavior normal.        Thought Content: Thought content normal.           Assessment & Plan:  R ear drainage- new.  External ear and ear canal are both damp.  No obvious TM perforation but this is likely given her sxs and drainage.  Hopefully perforation is  small- since it's not visible today- and will heal spontaneously.  Since she is not sure what medication she got in [place], will start Amoxicillin  to treat any residual infxn.  Reviewed supportive care and red flags that should prompt return.  If no improvement in drainage/hearing, will refer to ENT.  Pt expressed understanding and is in agreement w/ plan.   URI- new.  Pt w/o fever, chills, body aches.  + congestion and cough.  No evidence of bacterial infxn on PE but would be covered by Amox given for her ear.  Reviewed supportive care and red flags that should prompt return.  Pt expressed understanding and is in agreement w/ plan.

## 2023-06-26 NOTE — Patient Instructions (Signed)
 Follow up as needed or as scheduled You do have a bit of fluid draining from your ear, but thankfully no large hole in the ear drum START the Amoxicillin  twice daily- w/ food- to treat any possible residual infection Keep ear dry when showering (you can use a swimmers ear plug or cotton ball) ADD an OTC decongestant like Dayquil or phenylephrine to help w/ congestion and pressure CONTINUE the Claritin daily Drink LOTS of fluids If pain or drainage or hearing loss continues, please let me know so we can refer to ENT Call with any questions or concerns Hang In There! HAPPY EARLY BIRTHDAY!

## 2023-08-01 ENCOUNTER — Other Ambulatory Visit: Payer: Self-pay | Admitting: Family Medicine

## 2023-08-01 DIAGNOSIS — Z1231 Encounter for screening mammogram for malignant neoplasm of breast: Secondary | ICD-10-CM

## 2023-08-01 DIAGNOSIS — E785 Hyperlipidemia, unspecified: Secondary | ICD-10-CM

## 2023-08-31 ENCOUNTER — Ambulatory Visit
Admission: RE | Admit: 2023-08-31 | Discharge: 2023-08-31 | Disposition: A | Discharge status: Home / Self Care | Source: Ambulatory Visit | Attending: Family Medicine | Admitting: Family Medicine

## 2023-08-31 DIAGNOSIS — Z1231 Encounter for screening mammogram for malignant neoplasm of breast: Secondary | ICD-10-CM

## 2023-09-20 ENCOUNTER — Encounter: Payer: Managed Care, Other (non HMO) | Admitting: Family Medicine

## 2023-09-27 ENCOUNTER — Other Ambulatory Visit: Payer: Self-pay | Admitting: Family Medicine

## 2023-10-13 ENCOUNTER — Ambulatory Visit: Admitting: Family Medicine

## 2023-10-13 ENCOUNTER — Encounter: Payer: Self-pay | Admitting: Family Medicine

## 2023-10-13 VITALS — BP 126/84 | HR 67 | Temp 98.6°F | Ht 64.75 in | Wt 204.0 lb

## 2023-10-13 DIAGNOSIS — E559 Vitamin D deficiency, unspecified: Secondary | ICD-10-CM | POA: Diagnosis not present

## 2023-10-13 DIAGNOSIS — Z0000 Encounter for general adult medical examination without abnormal findings: Secondary | ICD-10-CM

## 2023-10-13 DIAGNOSIS — I10 Essential (primary) hypertension: Secondary | ICD-10-CM | POA: Diagnosis not present

## 2023-10-13 LAB — HEPATIC FUNCTION PANEL
ALT: 17 U/L (ref 0–35)
AST: 19 U/L (ref 0–37)
Albumin: 4.1 g/dL (ref 3.5–5.2)
Alkaline Phosphatase: 89 U/L (ref 39–117)
Bilirubin, Direct: 0.1 mg/dL (ref 0.0–0.3)
Total Bilirubin: 0.5 mg/dL (ref 0.2–1.2)
Total Protein: 6.9 g/dL (ref 6.0–8.3)

## 2023-10-13 LAB — CBC WITH DIFFERENTIAL/PLATELET
Basophils Absolute: 0 10*3/uL (ref 0.0–0.1)
Basophils Relative: 0.4 % (ref 0.0–3.0)
Eosinophils Absolute: 0.1 10*3/uL (ref 0.0–0.7)
Eosinophils Relative: 1.9 % (ref 0.0–5.0)
HCT: 42.9 % (ref 36.0–46.0)
Hemoglobin: 14.3 g/dL (ref 12.0–15.0)
Lymphocytes Relative: 29.2 % (ref 12.0–46.0)
Lymphs Abs: 1.6 10*3/uL (ref 0.7–4.0)
MCHC: 33.4 g/dL (ref 30.0–36.0)
MCV: 93.7 fl (ref 78.0–100.0)
Monocytes Absolute: 0.5 10*3/uL (ref 0.1–1.0)
Monocytes Relative: 8.5 % (ref 3.0–12.0)
Neutro Abs: 3.4 10*3/uL (ref 1.4–7.7)
Neutrophils Relative %: 60 % (ref 43.0–77.0)
Platelets: 270 10*3/uL (ref 150.0–400.0)
RBC: 4.58 Mil/uL (ref 3.87–5.11)
RDW: 12.9 % (ref 11.5–15.5)
WBC: 5.6 10*3/uL (ref 4.0–10.5)

## 2023-10-13 LAB — LIPID PANEL
Cholesterol: 224 mg/dL — ABNORMAL HIGH (ref 0–200)
HDL: 75.9 mg/dL (ref >39.00)
LDL Cholesterol: 121 mg/dL — ABNORMAL HIGH (ref 0–99)
NonHDL: 147.92
Total CHOL/HDL Ratio: 3
Triglycerides: 133 mg/dL (ref 0.0–149.0)
VLDL: 26.6 mg/dL (ref 0.0–40.0)

## 2023-10-13 LAB — BASIC METABOLIC PANEL WITH GFR
BUN: 16 mg/dL (ref 6–23)
CO2: 26 meq/L (ref 19–32)
Calcium: 9 mg/dL (ref 8.4–10.5)
Chloride: 102 meq/L (ref 96–112)
Creatinine, Ser: 0.69 mg/dL (ref 0.40–1.20)
GFR: 89.27 mL/min (ref >60.00)
Glucose, Bld: 79 mg/dL (ref 70–99)
Potassium: 4.2 meq/L (ref 3.5–5.1)
Sodium: 138 meq/L (ref 135–145)

## 2023-10-13 LAB — VITAMIN D 25 HYDROXY (VIT D DEFICIENCY, FRACTURES): VITD: 27.32 ng/mL — ABNORMAL LOW (ref 30.00–100.00)

## 2023-10-13 LAB — TSH: TSH: 1.88 u[IU]/mL (ref 0.35–5.50)

## 2023-10-13 NOTE — Progress Notes (Signed)
   Subjective:    Patient ID: Bianca Hogan, female    DOB: [birth year], 68 y.o.   MRN: [id number]  HPI CPE- UTD on mammo, colonoscopy, Tdap, PNA  Patient Care Team    Relationship Specialty Notifications Start End  Jess Morita, MD PCP - General Family Medicine  09/28/11   Tobin Forts, MD Consulting Physician Gastroenterology  09/22/14      Health Maintenance  Topic Date Due  . COVID-19 Vaccine (4 - 2024-25 season) 10/29/2023 (Originally 01/15/2023)  . INFLUENZA VACCINE  12/15/2023  . MAMMOGRAM  08/30/2024  . Colonoscopy  02/19/2026  . DTaP/Tdap/Td (3 - Td or Tdap) 01/23/2030  . Pneumonia Vaccine 56+ Years old  Completed  . DEXA SCAN  Completed  . Hepatitis C Screening  Completed  . Zoster Vaccines- Shingrix   Completed  . HPV VACCINES  Aged Out  . Meningococcal B Vaccine  Aged Out      Review of Systems Patient reports no vision/ hearing changes, adenopathy,fever, weight change,  persistant/recurrent hoarseness , swallowing issues, chest pain, palpitations, edema, persistant/recurrent cough, hemoptysis, dyspnea (rest/exertional/paroxysmal nocturnal), gastrointestinal bleeding (melena, rectal bleeding), abdominal pain, significant heartburn, bowel changes, GU symptoms (dysuria, hematuria, incontinence), Gyn symptoms (abnormal  bleeding, pain),  syncope, focal weakness, memory loss, numbness & tingling, skin/hair/nail changes, abnormal bruising or bleeding, anxiety, or depression.     Objective:   Physical Exam General Appearance:    Alert, cooperative, no distress, appears stated age  Head:    Normocephalic, without obvious abnormality, atraumatic  Eyes:    PERRL, conjunctiva/corneas clear, EOM's intact both eyes  Ears:    Normal TM's and external ear canals, both ears  Nose:   Nares normal, septum midline, mucosa normal, no drainage    or sinus tenderness  Throat:   Lips, mucosa, and tongue normal; teeth and gums normal  Neck:   Supple, symmetrical, trachea midline,  no adenopathy;    Thyroid : no enlargement/tenderness/nodules  Back:     Symmetric, no curvature, ROM normal, no CVA tenderness  Lungs:     Clear to auscultation bilaterally, respirations unlabored  Chest Wall:    No tenderness or deformity   Heart:    Regular rate and rhythm, S1 and S2 normal, no murmur, rub   or gallop  Breast Exam:    Deferred to mammo  Abdomen:     Soft, non-tender, bowel sounds active all four quadrants,    no masses, no organomegaly  Genitalia:    Deferred  Rectal:    Extremities:   Extremities normal, atraumatic, no cyanosis or edema  Pulses:   2+ and symmetric all extremities  Skin:   Skin color, texture, turgor normal, no rashes or lesions  Lymph nodes:   Cervical, supraclavicular, and axillary nodes normal  Neurologic:   CNII-XII intact, normal strength, sensation and reflexes    throughout          Assessment & Plan:

## 2023-10-13 NOTE — Patient Instructions (Signed)
 Follow up 6 months to recheck blood pressure and cholesterol We'll notify you of your lab results and make any changes if needed Take Claritin or Zyrtec  prior to arrival at the airport, once through security take a phenylephrine or similar decongestant prior to boarding Keep up the good work on healthy diet and regular exercise- you look great! Monitor your heart rate and record dates, times, how long it lasts, anything associated, etc Call with any questions or concerns Stay Safe!  Stay Healthy! Have a great summer!!!

## 2023-10-13 NOTE — Assessment & Plan Note (Signed)
Pt's PE WNL w/ exception of BMI.  UTD on mammo, colonoscopy, Tdap, PNA.  Check labs.  Anticipatory guidance provided.

## 2023-10-16 ENCOUNTER — Ambulatory Visit: Payer: Self-pay | Admitting: Family Medicine

## 2023-10-16 MED ORDER — VITAMIN D (ERGOCALCIFEROL) 1.25 MG (50000 UNIT) PO CAPS: 50000.0000 [IU] | ORAL_CAPSULE | ORAL | 0 refills | Status: DC

## 2023-10-21 ENCOUNTER — Other Ambulatory Visit: Payer: Self-pay | Admitting: Family Medicine

## 2023-10-21 DIAGNOSIS — E785 Hyperlipidemia, unspecified: Secondary | ICD-10-CM

## 2023-11-17 ENCOUNTER — Other Ambulatory Visit: Payer: Self-pay | Admitting: Family Medicine

## 2023-12-11 ENCOUNTER — Encounter: Payer: Self-pay | Admitting: Family Medicine

## 2024-01-06 ENCOUNTER — Other Ambulatory Visit: Payer: Self-pay | Admitting: Medical Genetics

## 2024-01-11 ENCOUNTER — Encounter: Payer: Self-pay | Admitting: Family Medicine

## 2024-01-11 ENCOUNTER — Ambulatory Visit: Admitting: Family Medicine

## 2024-01-11 VITALS — BP 130/72 | HR 82 | Temp 98.3°F | Ht 64.5 in | Wt 208.5 lb

## 2024-01-11 DIAGNOSIS — N909 Noninflammatory disorder of vulva and perineum, unspecified: Secondary | ICD-10-CM

## 2024-01-11 NOTE — Patient Instructions (Signed)
 Follow up as needed or as scheduled We'll call you to schedule your Urogyn appt for a complete evaluation Call with any questions or concerns Stay Safe!  Stay Healthy! Happy Labor Day!

## 2024-01-11 NOTE — Progress Notes (Signed)
   Subjective:    Patient ID: Bianca Hogan, female    DOB: 06-Jul-1955, 68 y.o.   MRN: [id number]  HPI Lump- pt notes there is a bulge between vagina and anus.  Noticed 3-4 weeks ago.  Not painful.  May change size but she has not noticed a relation between bearing down and size.  No trouble having BM or passing urine.   Review of Systems For ROS see HPI     Objective:   Physical Exam Vitals reviewed. Exam conducted with a chaperone present.  Constitutional:      General: She is not in acute distress.    Appearance: Normal appearance. She is obese. She is not ill-appearing.  Genitourinary:    Rectum: Normal. No mass or tenderness. Normal anal tone.     Comments: External anal skin tag, no lump/bulge appreciated in perineum Skin:    General: Skin is warm and dry.  Neurological:     Mental Status: She is alert.           Assessment & Plan:  Disorder of perineum- new.  Pt is concerned for possible rectocele.  I did not appreciate any lump or bulge today on exam.  Pt could not locate it either.  Rectal exam WNL.  Will refer to Urogyn for complete evaluation given pt's concern.  Pt expressed understanding and is in agreement w/ plan.

## 2024-01-13 ENCOUNTER — Other Ambulatory Visit: Payer: Self-pay | Admitting: Family Medicine

## 2024-01-16 ENCOUNTER — Other Ambulatory Visit: Payer: Self-pay | Admitting: Family Medicine

## 2024-01-16 DIAGNOSIS — E785 Hyperlipidemia, unspecified: Secondary | ICD-10-CM

## 2024-01-30 ENCOUNTER — Other Ambulatory Visit: Payer: Self-pay

## 2024-01-30 DIAGNOSIS — Z006 Encounter for examination for normal comparison and control in clinical research program: Secondary | ICD-10-CM

## 2024-01-31 ENCOUNTER — Other Ambulatory Visit: Payer: Self-pay | Admitting: Family Medicine

## 2024-01-31 ENCOUNTER — Encounter: Payer: Self-pay | Admitting: Family Medicine

## 2024-01-31 DIAGNOSIS — I10 Essential (primary) hypertension: Secondary | ICD-10-CM

## 2024-01-31 NOTE — Telephone Encounter (Signed)
 Reviewing her medication list, I see that furosemide  was sent back in 2022. Patient was advised to make an appt but wanted it confirmed by you

## 2024-02-01 MED ORDER — FUROSEMIDE 20 MG PO TABS: 20.0000 mg | ORAL_TABLET | Freq: Every day | ORAL | 0 refills | Status: AC

## 2024-02-01 NOTE — Telephone Encounter (Signed)
 Copied from CRM [id number]. Topic: Clinical - Medication Question >> Feb 01, 2024 11:03 AM [place] A wrote: Reason for CRM: Patient is calling about the message she sent in Mychart and she said she is going on Vacation Sunday. Patient stated that Dr. Mahlon has not answered but someone else did and she is waiting to hear from Dr.Tabori. Agent explained that it was the CMA hoever patient said she wants to make sure Dr.Tabori sees the message and answers.

## 2024-02-01 NOTE — Addendum Note (Signed)
 Addended by: Jacquita Mulhearn E on: 02/01/2024 03:50 PM   Modules accepted: Orders

## 2024-02-09 LAB — GENECONNECT MOLECULAR SCREEN: Genetic Analysis Overall Interpretation: NEGATIVE

## 2024-03-23 ENCOUNTER — Other Ambulatory Visit: Payer: Self-pay | Admitting: Family Medicine

## 2024-04-06 ENCOUNTER — Other Ambulatory Visit: Payer: Self-pay | Admitting: Family Medicine

## 2024-04-06 DIAGNOSIS — E785 Hyperlipidemia, unspecified: Secondary | ICD-10-CM

## 2024-04-08 ENCOUNTER — Encounter: Payer: Self-pay | Admitting: Family Medicine

## 2024-04-16 ENCOUNTER — Encounter: Payer: Self-pay | Admitting: Family Medicine

## 2024-04-16 ENCOUNTER — Ambulatory Visit (INDEPENDENT_AMBULATORY_CARE_PROVIDER_SITE_OTHER): Admitting: Family Medicine

## 2024-04-16 VITALS — BP 130/78 | HR 67 | Temp 97.7°F | Ht 64.5 in | Wt 211.0 lb

## 2024-04-16 DIAGNOSIS — E785 Hyperlipidemia, unspecified: Secondary | ICD-10-CM | POA: Diagnosis not present

## 2024-04-16 DIAGNOSIS — I10 Essential (primary) hypertension: Secondary | ICD-10-CM | POA: Diagnosis not present

## 2024-04-16 NOTE — Patient Instructions (Signed)
 Schedule your complete physical in 6 months We'll notify you of your lab results and make any changes if needed Keep up the good work on healthy diet and regular exercise- you're doing great! Call with any questions or concerns Stay Safe!  Stay Healthy! Merry Christmas!!!

## 2024-04-16 NOTE — Progress Notes (Unsigned)
° °  Subjective:    Patient ID: Bianca Hogan, female    DOB: September 09, 1955, 68 y.o.   MRN: [id number]  HPI HTN- chronic problem, on Lasix  20mg  daily and Losartan  50mg  daily w/ good control.  No CP, SOB, HA's, visual changes, edema.  Hyperlipidemia- chronic problem, on Lipitor 20mg  daily.  No abd pain, N/V.  Obesity- weight is stable and BMI 35.66.  Doing Body Pump twice weekly and yoga once weekly.   Review of Systems For ROS see HPI     Objective:   Physical Exam Vitals reviewed.  Constitutional:      General: She is not in acute distress.    Appearance: Normal appearance. She is well-developed. She is obese. She is not ill-appearing.  HENT:     Head: Normocephalic and atraumatic.  Eyes:     Conjunctiva/sclera: Conjunctivae normal.     Pupils: Pupils are equal, round, and reactive to light.  Neck:     Thyroid : No thyromegaly.  Cardiovascular:     Rate and Rhythm: Normal rate and regular rhythm.     Pulses: Normal pulses.     Heart sounds: Normal heart sounds. No murmur heard. Pulmonary:     Effort: Pulmonary effort is normal. No respiratory distress.     Breath sounds: Normal breath sounds.  Abdominal:     General: There is no distension.     Palpations: Abdomen is soft.     Tenderness: There is no abdominal tenderness.  Musculoskeletal:     Cervical back: Normal range of motion and neck supple.     Right lower leg: No edema.     Left lower leg: No edema.  Lymphadenopathy:     Cervical: No cervical adenopathy.  Skin:    General: Skin is warm and dry.  Neurological:     General: No focal deficit present.     Mental Status: She is alert and oriented to person, place, and time.  Psychiatric:        Mood and Affect: Mood normal.        Behavior: Behavior normal.        Thought Content: Thought content normal.           Assessment & Plan:

## 2024-04-17 LAB — CBC WITH DIFFERENTIAL/PLATELET
Basophils Absolute: 0 K/uL (ref 0.0–0.1)
Basophils Relative: 0.7 % (ref 0.0–3.0)
Eosinophils Absolute: 0.1 K/uL (ref 0.0–0.7)
Eosinophils Relative: 2.2 % (ref 0.0–5.0)
HCT: 43.4 % (ref 36.0–46.0)
Hemoglobin: 14.6 g/dL (ref 12.0–15.0)
Lymphocytes Relative: 26.1 % (ref 12.0–46.0)
Lymphs Abs: 1.5 K/uL (ref 0.7–4.0)
MCHC: 33.7 g/dL (ref 30.0–36.0)
MCV: 94.7 fl (ref 78.0–100.0)
Monocytes Absolute: 0.6 K/uL (ref 0.1–1.0)
Monocytes Relative: 10 % (ref 3.0–12.0)
Neutro Abs: 3.4 K/uL (ref 1.4–7.7)
Neutrophils Relative %: 61 % (ref 43.0–77.0)
Platelets: 262 K/uL (ref 150.0–400.0)
RBC: 4.59 Mil/uL (ref 3.87–5.11)
RDW: 12.8 % (ref 11.5–15.5)
WBC: 5.6 K/uL (ref 4.0–10.5)

## 2024-04-17 LAB — HEPATIC FUNCTION PANEL
ALT: 23 U/L (ref 0–35)
AST: 21 U/L (ref 0–37)
Albumin: 3.9 g/dL (ref 3.5–5.2)
Alkaline Phosphatase: 82 U/L (ref 39–117)
Bilirubin, Direct: 0 mg/dL (ref 0.0–0.3)
Total Bilirubin: 0.5 mg/dL (ref 0.2–1.2)
Total Protein: 6.7 g/dL (ref 6.0–8.3)

## 2024-04-17 LAB — LIPID PANEL
Cholesterol: 232 mg/dL — ABNORMAL HIGH (ref 0–200)
HDL: 68.5 mg/dL (ref >39.00)
LDL Cholesterol: 134 mg/dL — ABNORMAL HIGH (ref 0–99)
NonHDL: 163.05
Total CHOL/HDL Ratio: 3
Triglycerides: 143 mg/dL (ref 0.0–149.0)
VLDL: 28.6 mg/dL (ref 0.0–40.0)

## 2024-04-17 LAB — BASIC METABOLIC PANEL WITH GFR
BUN: 12 mg/dL (ref 6–23)
CO2: 26 meq/L (ref 19–32)
Calcium: 8.9 mg/dL (ref 8.4–10.5)
Chloride: 103 meq/L (ref 96–112)
Creatinine, Ser: 0.64 mg/dL (ref 0.40–1.20)
GFR: 90.57 mL/min (ref >60.00)
Glucose, Bld: 81 mg/dL (ref 70–99)
Potassium: 3.9 meq/L (ref 3.5–5.1)
Sodium: 138 meq/L (ref 135–145)

## 2024-04-17 LAB — TSH: TSH: 2.03 u[IU]/mL (ref 0.35–5.50)

## 2024-04-21 ENCOUNTER — Ambulatory Visit: Payer: Self-pay | Admitting: Family Medicine

## 2024-04-22 ENCOUNTER — Ambulatory Visit: Admitting: Obstetrics and Gynecology

## 2024-04-22 ENCOUNTER — Encounter: Payer: Self-pay | Admitting: Obstetrics and Gynecology

## 2024-04-22 VITALS — BP 137/90 | HR 70 | Ht 63.39 in | Wt 212.2 lb

## 2024-04-22 DIAGNOSIS — R350 Frequency of micturition: Secondary | ICD-10-CM | POA: Insufficient documentation

## 2024-04-22 DIAGNOSIS — N816 Rectocele: Secondary | ICD-10-CM | POA: Insufficient documentation

## 2024-04-22 LAB — POCT URINALYSIS DIP (CLINITEK)
Bilirubin, UA: NEGATIVE
Blood, UA: NEGATIVE
Glucose, UA: NEGATIVE mg/dL
Ketones, POC UA: NEGATIVE mg/dL
Leukocytes, UA: NEGATIVE
Nitrite, UA: NEGATIVE
POC PROTEIN,UA: NEGATIVE
Spec Grav, UA: 1.015 (ref 1.010–1.025)
Urobilinogen, UA: 0.2 U/dL
pH, UA: 6 (ref 5.0–8.0)

## 2024-04-22 NOTE — Progress Notes (Signed)
 New Patient Evaluation and Consultation  Referring Provider: Mahlon Comer BRAVO, MD PCP: Mahlon Comer BRAVO, MD Date of Service: 04/22/2024  SUBJECTIVE Chief Complaint: New Patient (Initial Visit) Bianca Hogan is a 68 y.o. female here today for disorder of the perineum.)  History of Present Illness: Bianca Hogan is a 68 y.o. White or Caucasian female seen in consultation at the request of Dr Mahlon for evaluation of prolapse.     Urinary Symptoms: Leaks urine with with urgency Does not leak daily Pad use: none Patient is not bothered by UI symptoms. She had a sling done in 2023 with [facility].   Day time voids 8.  Nocturia: 2 times per night to void. Voiding dysfunction:  empties bladder well.  Patient does not use a catheter to empty bladder.  When urinating, patient feels a weak stream and to push on her belly or vagina to empty bladder  UTIs: 0 UTI's in the last year.   Denies history of blood in urine and kidney or bladder stones   Pelvic Organ Prolapse Symptoms:                  Patient Admits to a feeling of a bulge the vaginal area. Feels it between the rectum and the vagina on left side.  This bulge is not bothersome.  Bowel Symptom: Bowel movements: 2-3 time(s) per day Stool consistency: loose Straining: yes.  Splinting: yes.  Incomplete evacuation: yes.  Patient Admits to accidental bowel leakage / fecal incontinence  Occurs: 1 time(s) per month  Consistency with leakage: liquid Bowel regimen: none. She had previously taken metamucil daily but did not notice a difference.   HM Colonoscopy          Upcoming     Colonoscopy (Every 5 Years) Next due on 02/19/2026    02/19/2021  COLONOSCOPY   Only the first 1 history entries have been loaded, but more history exists.                Sexual Function Sexually active: no.    Pelvic Pain Denies pelvic pain   Past Medical History:  Past Medical History:  Diagnosis Date    Allergy    Bronchitis 01/23/2011   GERD (gastroesophageal reflux disease)    HLD (hyperlipidemia)    Hypertension    Osteoarthritis    Shortness of breath    dyspenea; echo 12/10 EF 55-60%, nml; myoview 12/10 EF 83%, nml     Past Surgical History:   Past Surgical History:  Procedure Laterality Date   BLADDER SUSPENSION  04/16/2007   chest CT  11/14/2006   pulmonary nodule Rt middle    COLONOSCOPY  12/18/2015   Dr.Perry   CYSTOCELE REPAIR     DILATION AND CURETTAGE OF UTERUS  05/16/1984   miscarriage    POLYPECTOMY     RETROPUBIC SLING     2023   TONSILLECTOMY     VAGINAL HYSTERECTOMY     no cervix     Past OB/GYN History: OB History  No obstetric history on file.    Vaginal deliveries: 3,  Forceps/ Vacuum deliveries: 0, Cesarean section: 0  S/p hysterectomy  Medications: Patient has a current medication list which includes the following prescription(s): aspirin , atorvastatin , vitamin d , clotrimazole -betamethasone , cyanocobalamin, estradiol , ferrous sulfate, fluticasone , furosemide , ginkgo biloba extract, glucosamine-chondroit-vit c-mn, krill oil, losartan , lysine hcl, montelukast , multivitamin with minerals, pantoprazole , and probiotic product.   Allergies: Patient is allergic to egg protein-containing drug products, lisinopril, and naproxen.  Social History:  Social History   Tobacco Use   Smoking status: Former    Current packs/day: 0.00    Types: Cigarettes    Quit date: 05/25/1987    Years since quitting: 36.9   Smokeless tobacco: Never  Vaping Use   Vaping status: Never Used  Substance Use Topics   Alcohol use: Yes    Alcohol/week: 10.0 standard drinks of alcohol    Types: 10 Glasses of wine per week    Comment: 1-2 beers daily   Drug use: No    Relationship status: married Patient lives with her spouse.   Patient is not employed. Regular exercise: Yes: body pump, yoga History of abuse: No  Family History:   Family History  Problem Relation  Age of Onset   Diabetes Mother    Heart disease Mother    Heart disease Father    Diabetes Sister    Heart disease Sister    Uterine cancer Sister        pre- cancerous cells   Diabetes Sister    Heart disease Sister    Heart disease Maternal Grandmother    Heart disease Maternal Grandfather    Heart disease Paternal Grandmother    Heart disease Paternal Grandfather    Cancer Neg Hx    Breast cancer Neg Hx    Colon cancer Neg Hx    Colon polyps Neg Hx    Esophageal cancer Neg Hx    Rectal cancer Neg Hx    Stomach cancer Neg Hx      Review of Systems: ROS   OBJECTIVE Physical Exam: Vitals:   04/22/24 1316 04/22/24 1404  BP: (!) 160/101 (!) 137/90  Pulse: 77 70  Weight: 212 lb 3.2 oz (96.3 kg)   Height: 5' 3.39 (1.61 m)     Physical Exam Vitals reviewed. Exam conducted with a chaperone present.  Constitutional:      General: She is not in acute distress. Pulmonary:     Effort: Pulmonary effort is normal.  Abdominal:     General: There is no distension.     Palpations: Abdomen is soft.     Tenderness: There is no abdominal tenderness. There is no rebound.  Musculoskeletal:        General: No swelling. Normal range of motion.  Skin:    General: Skin is warm and dry.     Findings: No rash.  Neurological:     Mental Status: She is alert and oriented to person, place, and time.  Psychiatric:        Mood and Affect: Mood normal.        Behavior: Behavior normal.      GU / Detailed Urogynecologic Evaluation:  Pelvic Exam: Normal external female genitalia; Bartholin's and Skene's glands normal in appearance; urethral meatus normal in appearance, no urethral masses or discharge.   CST: negative  s/p hysterectomy: Speculum exam reveals normal vaginal mucosa with  atrophy and normal vaginal cuff.  Adnexa no mass, fullness, tenderness.    Pelvic floor strength I/V, puborectalis III/V external anal sphincter III/V  Pelvic floor musculature: Right levator  non-tender, Right obturator non-tender, Left levator non-tender, Left obturator non-tender  POP-Q:   POP-Q  -3                                            Aa   -3  Ba  8.5                                              C   4                                            Gh  6                                            Pb  9                                            tvl   0                                            Ap  0                                            Bp                                                 D      Rectal Exam:  Normal sphincter tone, small distal rectocele, enterocoele not present, no rectal masses, no sign of dyssynergia when asking the patient to bear down.  Post-Void Residual (PVR) by Bladder Scan: In order to evaluate bladder emptying, we discussed obtaining a postvoid residual and patient agreed to this procedure.  Procedure: The ultrasound unit was placed on the patient's abdomen in the suprapubic region after the patient had voided.    Post Void Residual - 04/22/24 1434       Post Void Residual   Post Void Residual 10 mL           Laboratory Results: Lab Results  Component Value Date   COLORU yellow 04/22/2024   CLARITYU clear 04/22/2024   GLUCOSEUR negative 04/22/2024   BILIRUBINUR negative 04/22/2024   SPECGRAV 1.015 04/22/2024   RBCUR negative 04/22/2024   PHUR 6.0 04/22/2024   UROBILINOGEN 0.2 04/22/2024   LEUKOCYTESUR Negative 04/22/2024    Lab Results  Component Value Date   CREATININE 0.64 04/16/2024   CREATININE 0.69 10/13/2023   CREATININE 0.75 03/20/2023    Lab Results  Component Value Date   HGBA1C 5.8 12/12/2018    Lab Results  Component Value Date   HGB 14.6 04/16/2024     ASSESSMENT AND PLAN Ms. Dewald is a 68 y.o. with:  1. Prolapse of posterior vaginal wall   2. Urinary frequency     Prolapse of posterior vaginal wall Assessment & Plan: Stage 0  anterior, Stage II posterior, Stage 0 apical prolapse - For treatment of pelvic  organ prolapse, we discussed options for management including expectant management, conservative management, and surgical management, such as Kegels, a pessary, pelvic floor physical therapy, and specific surgical procedures. - She is interested in expectant management. Advised her to avoid heavy lifting or straining as this can exacerbate symptoms. She can return for further management options if symptoms become more bothersome.     Urinary frequency Assessment & Plan: - Not bothered by her frequency or rare urge incontinence. She feels her bladder symptoms have improved significantly since her sling  Orders: -     POCT URINALYSIS DIP (CLINITEK)  Return as needed   Rosaline LOISE Caper, MD

## 2024-04-22 NOTE — Patient Instructions (Signed)
You have a stage 2 (out of 4) prolapse.  We discussed the fact that it is not life threatening but there are several treatment options. For treatment of pelvic organ prolapse, we discussed options for management including expectant management, conservative management, and surgical management, such as Kegels, a pessary, pelvic floor physical therapy, and specific surgical procedures.

## 2024-04-22 NOTE — Assessment & Plan Note (Signed)
 Stage 0 anterior, Stage II posterior, Stage 0 apical prolapse - For treatment of pelvic organ prolapse, we discussed options for management including expectant management, conservative management, and surgical management, such as Kegels, a pessary, pelvic floor physical therapy, and specific surgical procedures. - She is interested in expectant management. Advised her to avoid heavy lifting or straining as this can exacerbate symptoms. She can return for further management options if symptoms become more bothersome.

## 2024-04-22 NOTE — Assessment & Plan Note (Signed)
-   Not bothered by her frequency or rare urge incontinence. She feels her bladder symptoms have improved significantly since her sling

## 2024-04-24 ENCOUNTER — Other Ambulatory Visit: Payer: Self-pay | Admitting: Family Medicine

## 2024-04-24 DIAGNOSIS — E785 Hyperlipidemia, unspecified: Secondary | ICD-10-CM

## 2024-04-28 NOTE — Assessment & Plan Note (Signed)
Chronic problem.  On Lipitor 20mg daily w/o difficulty.  Check labs.  Adjust meds prn  

## 2024-04-28 NOTE — Assessment & Plan Note (Signed)
 Chronic problem.  On Lasix  and Losartan  w/ good control.  Currently asymptomatic.  Check labs due to ARB and diuretic use.  No anticipated med changes.  Will follow.

## 2024-04-28 NOTE — Assessment & Plan Note (Signed)
 Ongoing issue.  Weight and BMI are stable but given her BMI and comorbidities, this qualifies as morbid obesity.  She is doing Body Pump twice weekly and Yoga weekly.  Applauded her efforts.  Will continue to follow.

## 2024-04-30 ENCOUNTER — Other Ambulatory Visit: Payer: Self-pay | Admitting: Family Medicine

## 2024-05-01 ENCOUNTER — Other Ambulatory Visit: Payer: Self-pay | Admitting: Family Medicine

## 2024-05-01 DIAGNOSIS — E785 Hyperlipidemia, unspecified: Secondary | ICD-10-CM

## 2024-05-27 ENCOUNTER — Encounter: Payer: Self-pay | Admitting: *Deleted

## 2024-10-14 ENCOUNTER — Encounter: Admitting: Family Medicine

## 2207-11-14 LAB — CONVERTED CEMR LAB: Pap Smear: NORMAL
